# Patient Record
Sex: Female | Born: 1975 | Race: White | Hispanic: No | Smoking: Current every day smoker
Health system: Southern US, Community
[De-identification: ages and names within clinical notes are randomized; demographics above are authoritative.]

## PROBLEM LIST (undated history)

## (undated) DIAGNOSIS — F419 Anxiety disorder, unspecified: Secondary | ICD-10-CM

## (undated) DIAGNOSIS — F329 Major depressive disorder, single episode, unspecified: Secondary | ICD-10-CM

## (undated) DIAGNOSIS — K219 Gastro-esophageal reflux disease without esophagitis: Secondary | ICD-10-CM

## (undated) DIAGNOSIS — F4001 Agoraphobia with panic disorder: Secondary | ICD-10-CM

## (undated) DIAGNOSIS — Z86711 Personal history of pulmonary embolism: Secondary | ICD-10-CM

## (undated) DIAGNOSIS — G43009 Migraine without aura, not intractable, without status migrainosus: Secondary | ICD-10-CM

## (undated) DIAGNOSIS — Z720 Tobacco use: Secondary | ICD-10-CM

## (undated) DIAGNOSIS — Z8719 Personal history of other diseases of the digestive system: Secondary | ICD-10-CM

## (undated) DIAGNOSIS — F32A Depression, unspecified: Secondary | ICD-10-CM

## (undated) DIAGNOSIS — D6851 Activated protein C resistance: Secondary | ICD-10-CM

## (undated) DIAGNOSIS — F410 Panic disorder [episodic paroxysmal anxiety] without agoraphobia: Secondary | ICD-10-CM

## (undated) HISTORY — DX: Major depressive disorder, single episode, unspecified: F32.9

## (undated) HISTORY — DX: Anxiety disorder, unspecified: F41.9

## (undated) HISTORY — DX: Panic disorder (episodic paroxysmal anxiety): F41.0

## (undated) HISTORY — DX: Depression, unspecified: F32.A

## (undated) HISTORY — DX: Migraine without aura, not intractable, without status migrainosus: G43.009

## (undated) HISTORY — DX: Gastro-esophageal reflux disease without esophagitis: K21.9

## (undated) HISTORY — DX: Personal history of other diseases of the digestive system: Z87.19

## (undated) HISTORY — DX: Tobacco use: Z72.0

## (undated) HISTORY — DX: Agoraphobia with panic disorder: F40.01

---

## 1998-03-13 ENCOUNTER — Inpatient Hospital Stay (HOSPITAL_COMMUNITY): Admission: AD | Admit: 1998-03-13 | Discharge: 1998-03-13 | Payer: Self-pay | Admitting: Obstetrics and Gynecology

## 1998-06-01 ENCOUNTER — Inpatient Hospital Stay (HOSPITAL_COMMUNITY): Admission: AD | Admit: 1998-06-01 | Discharge: 1998-06-01 | Payer: Self-pay | Admitting: Obstetrics and Gynecology

## 1998-06-20 ENCOUNTER — Inpatient Hospital Stay (HOSPITAL_COMMUNITY): Admission: AD | Admit: 1998-06-20 | Discharge: 1998-06-20 | Payer: Self-pay | Admitting: *Deleted

## 1998-07-18 ENCOUNTER — Inpatient Hospital Stay (HOSPITAL_COMMUNITY): Admission: AD | Admit: 1998-07-18 | Discharge: 1998-07-18 | Payer: Self-pay | Admitting: Obstetrics and Gynecology

## 1998-07-22 ENCOUNTER — Inpatient Hospital Stay (HOSPITAL_COMMUNITY): Admission: AD | Admit: 1998-07-22 | Discharge: 1998-07-22 | Payer: Self-pay | Admitting: Obstetrics and Gynecology

## 1998-07-27 ENCOUNTER — Inpatient Hospital Stay (HOSPITAL_COMMUNITY): Admission: AD | Admit: 1998-07-27 | Discharge: 1998-07-29 | Payer: Self-pay | Admitting: Obstetrics & Gynecology

## 1998-08-19 ENCOUNTER — Encounter: Payer: Self-pay | Admitting: Obstetrics and Gynecology

## 1998-08-19 ENCOUNTER — Encounter: Payer: Self-pay | Admitting: Emergency Medicine

## 1998-08-19 ENCOUNTER — Observation Stay (HOSPITAL_COMMUNITY): Admission: EM | Admit: 1998-08-19 | Discharge: 1998-08-20 | Payer: Self-pay | Admitting: Emergency Medicine

## 1998-10-29 HISTORY — PX: CAROTID STENT: SHX1301

## 1999-11-18 ENCOUNTER — Emergency Department (HOSPITAL_COMMUNITY): Admission: EM | Admit: 1999-11-18 | Discharge: 1999-11-18 | Payer: Self-pay | Admitting: Emergency Medicine

## 2000-01-06 ENCOUNTER — Encounter: Admission: RE | Admit: 2000-01-06 | Discharge: 2000-01-06 | Payer: Self-pay | Admitting: Family Medicine

## 2000-01-12 ENCOUNTER — Emergency Department (HOSPITAL_COMMUNITY): Admission: EM | Admit: 2000-01-12 | Discharge: 2000-01-12 | Payer: Self-pay | Admitting: Emergency Medicine

## 2000-01-13 ENCOUNTER — Encounter: Admission: RE | Admit: 2000-01-13 | Discharge: 2000-01-13 | Payer: Self-pay | Admitting: Family Medicine

## 2000-03-17 ENCOUNTER — Encounter: Admission: RE | Admit: 2000-03-17 | Discharge: 2000-03-17 | Payer: Self-pay | Admitting: Family Medicine

## 2000-07-13 ENCOUNTER — Encounter: Admission: RE | Admit: 2000-07-13 | Discharge: 2000-07-13 | Payer: Self-pay | Admitting: Family Medicine

## 2000-08-04 ENCOUNTER — Encounter: Admission: RE | Admit: 2000-08-04 | Discharge: 2000-08-04 | Payer: Self-pay | Admitting: Family Medicine

## 2000-09-09 ENCOUNTER — Encounter: Admission: RE | Admit: 2000-09-09 | Discharge: 2000-09-09 | Payer: Self-pay | Admitting: Family Medicine

## 2000-10-26 ENCOUNTER — Emergency Department (HOSPITAL_COMMUNITY): Admission: EM | Admit: 2000-10-26 | Discharge: 2000-10-26 | Payer: Self-pay | Admitting: Emergency Medicine

## 2000-11-23 ENCOUNTER — Encounter: Admission: RE | Admit: 2000-11-23 | Discharge: 2000-11-23 | Payer: Self-pay | Admitting: Family Medicine

## 2000-12-14 ENCOUNTER — Encounter: Admission: RE | Admit: 2000-12-14 | Discharge: 2000-12-14 | Payer: Self-pay | Admitting: Family Medicine

## 2001-01-10 ENCOUNTER — Encounter: Payer: Self-pay | Admitting: *Deleted

## 2001-01-10 ENCOUNTER — Encounter: Admission: RE | Admit: 2001-01-10 | Discharge: 2001-01-10 | Payer: Self-pay | Admitting: Family Medicine

## 2001-01-10 ENCOUNTER — Ambulatory Visit (HOSPITAL_COMMUNITY): Admission: RE | Admit: 2001-01-10 | Discharge: 2001-01-10 | Payer: Self-pay | Admitting: *Deleted

## 2001-01-12 ENCOUNTER — Encounter: Payer: Self-pay | Admitting: Sports Medicine

## 2001-01-12 ENCOUNTER — Encounter: Admission: RE | Admit: 2001-01-12 | Discharge: 2001-01-12 | Payer: Self-pay | Admitting: Sports Medicine

## 2001-01-25 ENCOUNTER — Encounter: Admission: RE | Admit: 2001-01-25 | Discharge: 2001-01-25 | Payer: Self-pay | Admitting: Family Medicine

## 2001-01-27 ENCOUNTER — Encounter: Admission: RE | Admit: 2001-01-27 | Discharge: 2001-01-27 | Payer: Self-pay | Admitting: Family Medicine

## 2001-03-08 ENCOUNTER — Encounter: Admission: RE | Admit: 2001-03-08 | Discharge: 2001-03-08 | Payer: Self-pay | Admitting: Family Medicine

## 2001-03-08 ENCOUNTER — Other Ambulatory Visit: Admission: RE | Admit: 2001-03-08 | Discharge: 2001-03-08 | Payer: Self-pay | Admitting: Gynecology

## 2001-03-20 ENCOUNTER — Encounter: Admission: RE | Admit: 2001-03-20 | Discharge: 2001-03-20 | Payer: Self-pay | Admitting: Family Medicine

## 2001-04-03 ENCOUNTER — Encounter: Admission: RE | Admit: 2001-04-03 | Discharge: 2001-04-03 | Payer: Self-pay | Admitting: Family Medicine

## 2001-05-26 ENCOUNTER — Encounter: Admission: RE | Admit: 2001-05-26 | Discharge: 2001-05-26 | Payer: Self-pay | Admitting: Family Medicine

## 2001-07-10 ENCOUNTER — Encounter: Admission: RE | Admit: 2001-07-10 | Discharge: 2001-07-10 | Payer: Self-pay | Admitting: Family Medicine

## 2001-07-24 ENCOUNTER — Encounter: Admission: RE | Admit: 2001-07-24 | Discharge: 2001-07-24 | Payer: Self-pay | Admitting: Family Medicine

## 2001-08-01 ENCOUNTER — Encounter: Admission: RE | Admit: 2001-08-01 | Discharge: 2001-08-01 | Payer: Self-pay | Admitting: Family Medicine

## 2001-08-07 ENCOUNTER — Encounter: Admission: RE | Admit: 2001-08-07 | Discharge: 2001-08-07 | Payer: Self-pay | Admitting: Family Medicine

## 2001-09-07 ENCOUNTER — Encounter: Admission: RE | Admit: 2001-09-07 | Discharge: 2001-09-07 | Payer: Self-pay | Admitting: Family Medicine

## 2001-09-11 ENCOUNTER — Encounter: Admission: RE | Admit: 2001-09-11 | Discharge: 2001-09-11 | Payer: Self-pay | Admitting: Family Medicine

## 2001-10-25 ENCOUNTER — Encounter: Admission: RE | Admit: 2001-10-25 | Discharge: 2001-10-25 | Payer: Self-pay | Admitting: Family Medicine

## 2001-10-30 ENCOUNTER — Encounter: Admission: RE | Admit: 2001-10-30 | Discharge: 2001-10-30 | Payer: Self-pay | Admitting: Family Medicine

## 2001-12-08 ENCOUNTER — Encounter: Admission: RE | Admit: 2001-12-08 | Discharge: 2001-12-08 | Payer: Self-pay | Admitting: Family Medicine

## 2002-01-24 ENCOUNTER — Encounter: Admission: RE | Admit: 2002-01-24 | Discharge: 2002-01-24 | Payer: Self-pay | Admitting: Family Medicine

## 2002-02-20 ENCOUNTER — Encounter: Admission: RE | Admit: 2002-02-20 | Discharge: 2002-02-20 | Payer: Self-pay | Admitting: Family Medicine

## 2002-02-22 ENCOUNTER — Encounter: Admission: RE | Admit: 2002-02-22 | Discharge: 2002-02-22 | Payer: Self-pay | Admitting: Family Medicine

## 2002-03-14 ENCOUNTER — Encounter: Admission: RE | Admit: 2002-03-14 | Discharge: 2002-03-14 | Payer: Self-pay | Admitting: Family Medicine

## 2002-03-14 ENCOUNTER — Other Ambulatory Visit: Admission: RE | Admit: 2002-03-14 | Discharge: 2002-03-14 | Payer: Self-pay | Admitting: Family Medicine

## 2002-03-23 ENCOUNTER — Encounter: Admission: RE | Admit: 2002-03-23 | Discharge: 2002-03-23 | Payer: Self-pay | Admitting: Family Medicine

## 2002-04-24 ENCOUNTER — Encounter: Admission: RE | Admit: 2002-04-24 | Discharge: 2002-04-24 | Payer: Self-pay | Admitting: Family Medicine

## 2002-06-06 ENCOUNTER — Encounter: Admission: RE | Admit: 2002-06-06 | Discharge: 2002-06-06 | Payer: Self-pay | Admitting: Sports Medicine

## 2002-06-13 ENCOUNTER — Encounter: Admission: RE | Admit: 2002-06-13 | Discharge: 2002-06-13 | Payer: Self-pay | Admitting: Family Medicine

## 2002-06-20 ENCOUNTER — Encounter: Admission: RE | Admit: 2002-06-20 | Discharge: 2002-06-20 | Payer: Self-pay | Admitting: Family Medicine

## 2002-07-17 ENCOUNTER — Encounter: Admission: RE | Admit: 2002-07-17 | Discharge: 2002-07-17 | Payer: Self-pay | Admitting: Family Medicine

## 2002-07-25 ENCOUNTER — Encounter: Admission: RE | Admit: 2002-07-25 | Discharge: 2002-07-25 | Payer: Self-pay | Admitting: Family Medicine

## 2002-09-12 ENCOUNTER — Encounter: Admission: RE | Admit: 2002-09-12 | Discharge: 2002-09-12 | Payer: Self-pay | Admitting: Family Medicine

## 2002-09-19 ENCOUNTER — Encounter: Admission: RE | Admit: 2002-09-19 | Discharge: 2002-09-19 | Payer: Self-pay | Admitting: Family Medicine

## 2002-10-05 ENCOUNTER — Encounter: Admission: RE | Admit: 2002-10-05 | Discharge: 2002-10-05 | Payer: Self-pay | Admitting: Family Medicine

## 2002-10-08 ENCOUNTER — Encounter: Admission: RE | Admit: 2002-10-08 | Discharge: 2002-10-08 | Payer: Self-pay | Admitting: Sports Medicine

## 2002-10-08 ENCOUNTER — Encounter: Payer: Self-pay | Admitting: Sports Medicine

## 2002-10-17 ENCOUNTER — Encounter: Admission: RE | Admit: 2002-10-17 | Discharge: 2002-10-17 | Payer: Self-pay | Admitting: Family Medicine

## 2002-12-13 ENCOUNTER — Encounter: Admission: RE | Admit: 2002-12-13 | Discharge: 2002-12-13 | Payer: Self-pay | Admitting: Family Medicine

## 2003-01-10 ENCOUNTER — Encounter: Admission: RE | Admit: 2003-01-10 | Discharge: 2003-01-10 | Payer: Self-pay | Admitting: Family Medicine

## 2003-02-21 ENCOUNTER — Encounter: Admission: RE | Admit: 2003-02-21 | Discharge: 2003-02-21 | Payer: Self-pay | Admitting: Family Medicine

## 2003-03-01 ENCOUNTER — Encounter: Admission: RE | Admit: 2003-03-01 | Discharge: 2003-03-01 | Payer: Self-pay | Admitting: Family Medicine

## 2003-03-04 ENCOUNTER — Encounter: Admission: RE | Admit: 2003-03-04 | Discharge: 2003-03-04 | Payer: Self-pay | Admitting: Family Medicine

## 2003-03-29 ENCOUNTER — Encounter: Admission: RE | Admit: 2003-03-29 | Discharge: 2003-03-29 | Payer: Self-pay | Admitting: Family Medicine

## 2003-04-23 ENCOUNTER — Encounter: Admission: RE | Admit: 2003-04-23 | Discharge: 2003-04-23 | Payer: Self-pay | Admitting: Family Medicine

## 2003-04-24 ENCOUNTER — Encounter: Admission: RE | Admit: 2003-04-24 | Discharge: 2003-04-24 | Payer: Self-pay | Admitting: Family Medicine

## 2003-06-05 ENCOUNTER — Encounter: Admission: RE | Admit: 2003-06-05 | Discharge: 2003-06-05 | Payer: Self-pay | Admitting: Family Medicine

## 2003-06-07 ENCOUNTER — Encounter: Admission: RE | Admit: 2003-06-07 | Discharge: 2003-06-07 | Payer: Self-pay | Admitting: Sports Medicine

## 2003-06-20 ENCOUNTER — Encounter: Admission: RE | Admit: 2003-06-20 | Discharge: 2003-06-20 | Payer: Self-pay | Admitting: Family Medicine

## 2003-08-02 ENCOUNTER — Encounter: Admission: RE | Admit: 2003-08-02 | Discharge: 2003-08-02 | Payer: Self-pay | Admitting: Family Medicine

## 2003-08-02 ENCOUNTER — Other Ambulatory Visit: Admission: RE | Admit: 2003-08-02 | Discharge: 2003-08-02 | Payer: Self-pay | Admitting: Family Medicine

## 2003-08-09 ENCOUNTER — Encounter: Admission: RE | Admit: 2003-08-09 | Discharge: 2003-08-09 | Payer: Self-pay | Admitting: Family Medicine

## 2003-10-29 ENCOUNTER — Encounter: Admission: RE | Admit: 2003-10-29 | Discharge: 2003-10-29 | Payer: Self-pay | Admitting: Sports Medicine

## 2004-04-08 ENCOUNTER — Encounter: Admission: RE | Admit: 2004-04-08 | Discharge: 2004-04-08 | Payer: Self-pay | Admitting: Family Medicine

## 2004-06-03 ENCOUNTER — Encounter: Admission: RE | Admit: 2004-06-03 | Discharge: 2004-06-03 | Payer: Self-pay | Admitting: Family Medicine

## 2004-07-28 ENCOUNTER — Encounter: Admission: RE | Admit: 2004-07-28 | Discharge: 2004-07-28 | Payer: Self-pay | Admitting: Sports Medicine

## 2004-09-30 ENCOUNTER — Other Ambulatory Visit: Admission: RE | Admit: 2004-09-30 | Discharge: 2004-09-30 | Payer: Self-pay | Admitting: Family Medicine

## 2004-09-30 ENCOUNTER — Ambulatory Visit: Payer: Self-pay | Admitting: Family Medicine

## 2004-12-16 ENCOUNTER — Ambulatory Visit: Payer: Self-pay | Admitting: Family Medicine

## 2004-12-30 ENCOUNTER — Ambulatory Visit: Payer: Self-pay | Admitting: Family Medicine

## 2005-03-01 ENCOUNTER — Ambulatory Visit: Payer: Self-pay | Admitting: Family Medicine

## 2005-05-13 ENCOUNTER — Ambulatory Visit: Payer: Self-pay | Admitting: Family Medicine

## 2005-08-10 ENCOUNTER — Ambulatory Visit: Payer: Self-pay | Admitting: Family Medicine

## 2005-08-26 ENCOUNTER — Other Ambulatory Visit: Admission: RE | Admit: 2005-08-26 | Discharge: 2005-08-26 | Payer: Self-pay | Admitting: Obstetrics and Gynecology

## 2005-08-27 ENCOUNTER — Ambulatory Visit: Payer: Self-pay | Admitting: Family Medicine

## 2005-08-29 ENCOUNTER — Encounter (INDEPENDENT_AMBULATORY_CARE_PROVIDER_SITE_OTHER): Payer: Self-pay | Admitting: *Deleted

## 2005-09-01 ENCOUNTER — Ambulatory Visit: Payer: Self-pay | Admitting: Sports Medicine

## 2005-09-02 ENCOUNTER — Ambulatory Visit (HOSPITAL_COMMUNITY): Admission: RE | Admit: 2005-09-02 | Discharge: 2005-09-02 | Payer: Self-pay | Admitting: Family Medicine

## 2005-09-06 ENCOUNTER — Inpatient Hospital Stay (HOSPITAL_COMMUNITY): Admission: AD | Admit: 2005-09-06 | Discharge: 2005-09-06 | Payer: Self-pay | Admitting: Obstetrics & Gynecology

## 2005-09-07 ENCOUNTER — Inpatient Hospital Stay (HOSPITAL_COMMUNITY): Admission: AD | Admit: 2005-09-07 | Discharge: 2005-09-07 | Payer: Self-pay | Admitting: *Deleted

## 2005-09-08 ENCOUNTER — Ambulatory Visit: Payer: Self-pay | Admitting: Family Medicine

## 2005-09-08 ENCOUNTER — Inpatient Hospital Stay (HOSPITAL_COMMUNITY): Admission: AD | Admit: 2005-09-08 | Discharge: 2005-09-08 | Payer: Self-pay | Admitting: Obstetrics and Gynecology

## 2005-09-15 ENCOUNTER — Inpatient Hospital Stay (HOSPITAL_COMMUNITY): Admission: AD | Admit: 2005-09-15 | Discharge: 2005-09-15 | Payer: Self-pay | Admitting: Obstetrics and Gynecology

## 2005-11-15 ENCOUNTER — Ambulatory Visit: Payer: Self-pay | Admitting: Family Medicine

## 2006-01-13 ENCOUNTER — Inpatient Hospital Stay (HOSPITAL_COMMUNITY): Admission: AD | Admit: 2006-01-13 | Discharge: 2006-01-13 | Payer: Self-pay | Admitting: Obstetrics & Gynecology

## 2006-01-14 ENCOUNTER — Inpatient Hospital Stay (HOSPITAL_COMMUNITY): Admission: AD | Admit: 2006-01-14 | Discharge: 2006-01-14 | Payer: Self-pay | Admitting: *Deleted

## 2006-01-16 ENCOUNTER — Inpatient Hospital Stay (HOSPITAL_COMMUNITY): Admission: AD | Admit: 2006-01-16 | Discharge: 2006-01-16 | Payer: Self-pay | Admitting: Obstetrics and Gynecology

## 2006-01-26 ENCOUNTER — Inpatient Hospital Stay (HOSPITAL_COMMUNITY): Admission: RE | Admit: 2006-01-26 | Discharge: 2006-01-26 | Payer: Self-pay | Admitting: *Deleted

## 2006-03-14 ENCOUNTER — Ambulatory Visit: Payer: Self-pay | Admitting: Family Medicine

## 2006-03-24 ENCOUNTER — Ambulatory Visit: Payer: Self-pay | Admitting: Family Medicine

## 2006-03-31 ENCOUNTER — Inpatient Hospital Stay (HOSPITAL_COMMUNITY): Admission: AD | Admit: 2006-03-31 | Discharge: 2006-03-31 | Payer: Self-pay | Admitting: Gynecology

## 2006-04-06 ENCOUNTER — Ambulatory Visit: Payer: Self-pay | Admitting: Sports Medicine

## 2006-04-12 ENCOUNTER — Ambulatory Visit (HOSPITAL_COMMUNITY): Admission: RE | Admit: 2006-04-12 | Discharge: 2006-04-12 | Payer: Self-pay | Admitting: *Deleted

## 2006-04-12 ENCOUNTER — Ambulatory Visit: Payer: Self-pay | Admitting: *Deleted

## 2006-04-26 ENCOUNTER — Ambulatory Visit: Payer: Self-pay | Admitting: Family Medicine

## 2006-04-27 ENCOUNTER — Ambulatory Visit (HOSPITAL_COMMUNITY): Admission: RE | Admit: 2006-04-27 | Discharge: 2006-04-27 | Payer: Self-pay | Admitting: Obstetrics & Gynecology

## 2006-05-24 ENCOUNTER — Ambulatory Visit: Payer: Self-pay | Admitting: Family Medicine

## 2006-06-05 ENCOUNTER — Inpatient Hospital Stay (HOSPITAL_COMMUNITY): Admission: AD | Admit: 2006-06-05 | Discharge: 2006-06-05 | Payer: Self-pay | Admitting: Family Medicine

## 2006-06-19 ENCOUNTER — Inpatient Hospital Stay (HOSPITAL_COMMUNITY): Admission: AD | Admit: 2006-06-19 | Discharge: 2006-06-19 | Payer: Self-pay | Admitting: Gynecology

## 2006-06-19 ENCOUNTER — Ambulatory Visit: Payer: Self-pay | Admitting: Obstetrics and Gynecology

## 2006-06-21 ENCOUNTER — Ambulatory Visit: Payer: Self-pay | Admitting: Family Medicine

## 2006-07-11 ENCOUNTER — Ambulatory Visit: Payer: Self-pay | Admitting: Family Medicine

## 2006-07-25 ENCOUNTER — Ambulatory Visit: Payer: Self-pay | Admitting: Family Medicine

## 2006-07-26 ENCOUNTER — Ambulatory Visit (HOSPITAL_COMMUNITY): Admission: RE | Admit: 2006-07-26 | Discharge: 2006-07-26 | Payer: Self-pay | Admitting: Family Medicine

## 2006-08-04 ENCOUNTER — Ambulatory Visit: Payer: Self-pay | Admitting: Family Medicine

## 2006-08-09 ENCOUNTER — Inpatient Hospital Stay (HOSPITAL_COMMUNITY): Admission: AD | Admit: 2006-08-09 | Discharge: 2006-08-09 | Payer: Self-pay | Admitting: Obstetrics and Gynecology

## 2006-08-17 ENCOUNTER — Ambulatory Visit: Payer: Self-pay | Admitting: Family Medicine

## 2006-08-23 ENCOUNTER — Inpatient Hospital Stay (HOSPITAL_COMMUNITY): Admission: AD | Admit: 2006-08-23 | Discharge: 2006-08-23 | Payer: Self-pay | Admitting: Gynecology

## 2006-08-30 ENCOUNTER — Inpatient Hospital Stay (HOSPITAL_COMMUNITY): Admission: AD | Admit: 2006-08-30 | Discharge: 2006-08-30 | Payer: Self-pay | Admitting: Obstetrics and Gynecology

## 2006-08-30 ENCOUNTER — Ambulatory Visit: Payer: Self-pay | Admitting: Obstetrics and Gynecology

## 2006-08-30 ENCOUNTER — Ambulatory Visit: Payer: Self-pay | Admitting: Family Medicine

## 2006-08-31 ENCOUNTER — Inpatient Hospital Stay (HOSPITAL_COMMUNITY): Admission: AD | Admit: 2006-08-31 | Discharge: 2006-08-31 | Payer: Self-pay | Admitting: Gynecology

## 2006-09-02 ENCOUNTER — Ambulatory Visit: Payer: Self-pay | Admitting: Family Medicine

## 2006-09-05 ENCOUNTER — Ambulatory Visit (HOSPITAL_COMMUNITY): Admission: RE | Admit: 2006-09-05 | Discharge: 2006-09-05 | Payer: Self-pay | Admitting: Family Medicine

## 2006-09-05 ENCOUNTER — Ambulatory Visit: Payer: Self-pay | Admitting: Obstetrics & Gynecology

## 2006-09-05 ENCOUNTER — Inpatient Hospital Stay (HOSPITAL_COMMUNITY): Admission: AD | Admit: 2006-09-05 | Discharge: 2006-09-06 | Payer: Self-pay | Admitting: Obstetrics and Gynecology

## 2006-09-06 ENCOUNTER — Inpatient Hospital Stay (HOSPITAL_COMMUNITY): Admission: AD | Admit: 2006-09-06 | Discharge: 2006-09-08 | Payer: Self-pay | Admitting: Obstetrics and Gynecology

## 2006-09-06 ENCOUNTER — Ambulatory Visit: Payer: Self-pay | Admitting: Obstetrics & Gynecology

## 2006-10-19 ENCOUNTER — Ambulatory Visit: Payer: Self-pay | Admitting: Family Medicine

## 2006-11-02 ENCOUNTER — Ambulatory Visit: Payer: Self-pay

## 2007-01-19 ENCOUNTER — Ambulatory Visit: Payer: Self-pay | Admitting: Family Medicine

## 2007-01-26 DIAGNOSIS — G43909 Migraine, unspecified, not intractable, without status migrainosus: Secondary | ICD-10-CM | POA: Insufficient documentation

## 2007-01-26 DIAGNOSIS — F410 Panic disorder [episodic paroxysmal anxiety] without agoraphobia: Secondary | ICD-10-CM

## 2007-01-26 DIAGNOSIS — N200 Calculus of kidney: Secondary | ICD-10-CM

## 2007-01-26 DIAGNOSIS — F17200 Nicotine dependence, unspecified, uncomplicated: Secondary | ICD-10-CM

## 2007-01-26 DIAGNOSIS — F411 Generalized anxiety disorder: Secondary | ICD-10-CM | POA: Insufficient documentation

## 2007-01-26 HISTORY — DX: Panic disorder (episodic paroxysmal anxiety): F41.0

## 2007-01-27 ENCOUNTER — Encounter (INDEPENDENT_AMBULATORY_CARE_PROVIDER_SITE_OTHER): Payer: Self-pay | Admitting: *Deleted

## 2007-03-17 ENCOUNTER — Ambulatory Visit: Payer: Self-pay | Admitting: Family Medicine

## 2007-03-17 ENCOUNTER — Encounter: Payer: Self-pay | Admitting: Family Medicine

## 2007-03-17 LAB — CONVERTED CEMR LAB
Beta hcg, urine, semiquantitative: POSITIVE
Eosinophils Absolute: 0.3 10*3/uL (ref 0.0–0.7)
Hepatitis B Surface Ag: NEGATIVE
Lymphs Abs: 2.7 10*3/uL (ref 0.7–3.3)
MCV: 89.2 fL (ref 78.0–100.0)
Neutrophils Relative %: 57 % (ref 43–77)
Platelets: 262 10*3/uL (ref 150–400)
Rh Type: POSITIVE
WBC: 8.3 10*3/uL (ref 4.0–10.5)

## 2007-03-23 ENCOUNTER — Encounter: Payer: Self-pay | Admitting: Family Medicine

## 2007-03-23 ENCOUNTER — Ambulatory Visit: Payer: Self-pay | Admitting: Sports Medicine

## 2007-04-11 ENCOUNTER — Telehealth: Payer: Self-pay | Admitting: *Deleted

## 2007-04-17 ENCOUNTER — Ambulatory Visit (HOSPITAL_COMMUNITY): Admission: RE | Admit: 2007-04-17 | Discharge: 2007-04-17 | Payer: Self-pay | Admitting: Family Medicine

## 2007-04-17 ENCOUNTER — Encounter: Payer: Self-pay | Admitting: Family Medicine

## 2007-04-17 ENCOUNTER — Ambulatory Visit: Payer: Self-pay | Admitting: Sports Medicine

## 2007-04-17 ENCOUNTER — Other Ambulatory Visit: Admission: RE | Admit: 2007-04-17 | Discharge: 2007-04-17 | Payer: Self-pay | Admitting: Family Medicine

## 2007-04-17 LAB — CONVERTED CEMR LAB
Chlamydia, DNA Probe: NEGATIVE
GC Probe Amp, Genital: NEGATIVE

## 2007-04-26 ENCOUNTER — Telehealth: Payer: Self-pay | Admitting: *Deleted

## 2007-05-23 ENCOUNTER — Ambulatory Visit: Payer: Self-pay | Admitting: Family Medicine

## 2007-05-23 ENCOUNTER — Encounter: Payer: Self-pay | Admitting: Family Medicine

## 2007-06-01 ENCOUNTER — Telehealth: Payer: Self-pay | Admitting: *Deleted

## 2007-06-03 ENCOUNTER — Telehealth: Payer: Self-pay | Admitting: Family Medicine

## 2007-06-14 ENCOUNTER — Telehealth: Payer: Self-pay | Admitting: Family Medicine

## 2007-06-17 ENCOUNTER — Telehealth: Payer: Self-pay | Admitting: Family Medicine

## 2007-06-20 ENCOUNTER — Ambulatory Visit: Payer: Self-pay | Admitting: Family Medicine

## 2007-06-23 ENCOUNTER — Telehealth: Payer: Self-pay | Admitting: *Deleted

## 2007-06-29 ENCOUNTER — Telehealth: Payer: Self-pay | Admitting: Family Medicine

## 2007-07-04 ENCOUNTER — Ambulatory Visit (HOSPITAL_COMMUNITY): Admission: RE | Admit: 2007-07-04 | Discharge: 2007-07-04 | Payer: Self-pay | Admitting: Family Medicine

## 2007-07-07 ENCOUNTER — Telehealth: Payer: Self-pay | Admitting: Family Medicine

## 2007-07-10 ENCOUNTER — Encounter: Payer: Self-pay | Admitting: Family Medicine

## 2007-07-27 ENCOUNTER — Inpatient Hospital Stay (HOSPITAL_COMMUNITY): Admission: AD | Admit: 2007-07-27 | Discharge: 2007-07-27 | Payer: Self-pay | Admitting: Family Medicine

## 2007-07-27 ENCOUNTER — Encounter: Payer: Self-pay | Admitting: Family Medicine

## 2007-07-27 ENCOUNTER — Ambulatory Visit: Payer: Self-pay | Admitting: Family Medicine

## 2007-07-27 ENCOUNTER — Ambulatory Visit: Payer: Self-pay | Admitting: Obstetrics and Gynecology

## 2007-07-27 LAB — CONVERTED CEMR LAB
Glucose, Urine, Semiquant: NEGATIVE
Protein, U semiquant: NEGATIVE

## 2007-07-28 ENCOUNTER — Telehealth: Payer: Self-pay | Admitting: Family Medicine

## 2007-08-01 ENCOUNTER — Telehealth: Payer: Self-pay | Admitting: *Deleted

## 2007-08-02 ENCOUNTER — Ambulatory Visit: Payer: Self-pay | Admitting: Obstetrics and Gynecology

## 2007-08-02 ENCOUNTER — Inpatient Hospital Stay (HOSPITAL_COMMUNITY): Admission: AD | Admit: 2007-08-02 | Discharge: 2007-08-02 | Payer: Self-pay | Admitting: Obstetrics & Gynecology

## 2007-08-02 ENCOUNTER — Telehealth: Payer: Self-pay | Admitting: *Deleted

## 2007-08-03 ENCOUNTER — Ambulatory Visit: Payer: Self-pay | Admitting: Family Medicine

## 2007-08-03 ENCOUNTER — Observation Stay (HOSPITAL_COMMUNITY): Admission: AD | Admit: 2007-08-03 | Discharge: 2007-08-04 | Payer: Self-pay | Admitting: Obstetrics and Gynecology

## 2007-08-03 ENCOUNTER — Ambulatory Visit: Payer: Self-pay | Admitting: *Deleted

## 2007-08-07 ENCOUNTER — Encounter: Payer: Self-pay | Admitting: Family Medicine

## 2007-08-07 ENCOUNTER — Ambulatory Visit (HOSPITAL_COMMUNITY): Admission: RE | Admit: 2007-08-07 | Discharge: 2007-08-07 | Payer: Self-pay | Admitting: *Deleted

## 2007-08-10 ENCOUNTER — Ambulatory Visit: Payer: Self-pay | Admitting: *Deleted

## 2007-08-15 ENCOUNTER — Ambulatory Visit (HOSPITAL_COMMUNITY): Admission: RE | Admit: 2007-08-15 | Discharge: 2007-08-15 | Payer: Self-pay | Admitting: *Deleted

## 2007-08-24 ENCOUNTER — Ambulatory Visit: Payer: Self-pay | Admitting: Obstetrics & Gynecology

## 2007-08-24 ENCOUNTER — Ambulatory Visit (HOSPITAL_COMMUNITY): Admission: RE | Admit: 2007-08-24 | Discharge: 2007-08-24 | Payer: Self-pay | Admitting: *Deleted

## 2007-09-07 ENCOUNTER — Ambulatory Visit (HOSPITAL_COMMUNITY): Admission: RE | Admit: 2007-09-07 | Discharge: 2007-09-07 | Payer: Self-pay | Admitting: *Deleted

## 2007-09-07 ENCOUNTER — Ambulatory Visit: Payer: Self-pay | Admitting: *Deleted

## 2007-09-14 ENCOUNTER — Ambulatory Visit: Payer: Self-pay | Admitting: Family Medicine

## 2007-09-22 ENCOUNTER — Inpatient Hospital Stay (HOSPITAL_COMMUNITY): Admission: AD | Admit: 2007-09-22 | Discharge: 2007-09-22 | Payer: Self-pay | Admitting: Obstetrics & Gynecology

## 2007-09-22 ENCOUNTER — Ambulatory Visit: Payer: Self-pay | Admitting: Obstetrics and Gynecology

## 2007-09-27 ENCOUNTER — Telehealth: Payer: Self-pay | Admitting: *Deleted

## 2007-09-28 ENCOUNTER — Ambulatory Visit (HOSPITAL_COMMUNITY): Admission: RE | Admit: 2007-09-28 | Discharge: 2007-09-28 | Payer: Self-pay | Admitting: *Deleted

## 2007-09-28 ENCOUNTER — Ambulatory Visit: Payer: Self-pay | Admitting: *Deleted

## 2007-10-01 ENCOUNTER — Ambulatory Visit: Payer: Self-pay | Admitting: Obstetrics and Gynecology

## 2007-10-01 ENCOUNTER — Inpatient Hospital Stay (HOSPITAL_COMMUNITY): Admission: AD | Admit: 2007-10-01 | Discharge: 2007-10-03 | Payer: Self-pay | Admitting: Obstetrics and Gynecology

## 2007-10-03 ENCOUNTER — Ambulatory Visit: Payer: Self-pay | Admitting: *Deleted

## 2007-10-03 ENCOUNTER — Inpatient Hospital Stay (HOSPITAL_COMMUNITY): Admission: AD | Admit: 2007-10-03 | Discharge: 2007-10-03 | Payer: Self-pay | Admitting: Obstetrics & Gynecology

## 2007-10-07 ENCOUNTER — Inpatient Hospital Stay (HOSPITAL_COMMUNITY): Admission: AD | Admit: 2007-10-07 | Discharge: 2007-10-07 | Payer: Self-pay | Admitting: Obstetrics & Gynecology

## 2007-10-07 ENCOUNTER — Ambulatory Visit: Payer: Self-pay | Admitting: *Deleted

## 2007-10-12 ENCOUNTER — Ambulatory Visit: Payer: Self-pay | Admitting: *Deleted

## 2007-10-13 ENCOUNTER — Ambulatory Visit (HOSPITAL_COMMUNITY): Admission: RE | Admit: 2007-10-13 | Discharge: 2007-10-13 | Payer: Self-pay | Admitting: *Deleted

## 2007-10-19 ENCOUNTER — Ambulatory Visit: Payer: Self-pay | Admitting: Obstetrics & Gynecology

## 2007-10-19 ENCOUNTER — Other Ambulatory Visit: Payer: Self-pay | Admitting: Obstetrics & Gynecology

## 2007-10-20 ENCOUNTER — Ambulatory Visit (HOSPITAL_COMMUNITY): Admission: RE | Admit: 2007-10-20 | Discharge: 2007-10-20 | Payer: Self-pay | Admitting: *Deleted

## 2007-10-27 ENCOUNTER — Encounter: Payer: Self-pay | Admitting: Obstetrics & Gynecology

## 2007-10-27 ENCOUNTER — Ambulatory Visit: Payer: Self-pay | Admitting: Obstetrics and Gynecology

## 2007-10-27 ENCOUNTER — Inpatient Hospital Stay (HOSPITAL_COMMUNITY): Admission: AD | Admit: 2007-10-27 | Discharge: 2007-10-29 | Payer: Self-pay | Admitting: *Deleted

## 2007-11-20 ENCOUNTER — Telehealth: Payer: Self-pay | Admitting: *Deleted

## 2008-01-01 ENCOUNTER — Ambulatory Visit: Payer: Self-pay | Admitting: Family Medicine

## 2008-01-01 LAB — CONVERTED CEMR LAB
Glucose, Urine, Semiquant: NEGATIVE
Specific Gravity, Urine: 1.015
WBC Urine, dipstick: NEGATIVE
pH: 6

## 2008-01-18 ENCOUNTER — Telehealth (INDEPENDENT_AMBULATORY_CARE_PROVIDER_SITE_OTHER): Payer: Self-pay | Admitting: *Deleted

## 2008-03-04 ENCOUNTER — Telehealth: Payer: Self-pay | Admitting: *Deleted

## 2008-03-05 ENCOUNTER — Ambulatory Visit: Payer: Self-pay | Admitting: Family Medicine

## 2008-03-05 ENCOUNTER — Encounter: Payer: Self-pay | Admitting: Family Medicine

## 2008-03-05 DIAGNOSIS — D281 Benign neoplasm of vagina: Secondary | ICD-10-CM

## 2008-03-05 LAB — CONVERTED CEMR LAB
Blood in Urine, dipstick: NEGATIVE
Ketones, urine, test strip: NEGATIVE
Nitrite: NEGATIVE
Urobilinogen, UA: 0.2

## 2008-03-06 ENCOUNTER — Encounter: Payer: Self-pay | Admitting: Family Medicine

## 2008-03-07 ENCOUNTER — Telehealth: Payer: Self-pay | Admitting: Family Medicine

## 2008-05-23 ENCOUNTER — Telehealth (INDEPENDENT_AMBULATORY_CARE_PROVIDER_SITE_OTHER): Payer: Self-pay | Admitting: *Deleted

## 2008-05-30 ENCOUNTER — Ambulatory Visit: Payer: Self-pay | Admitting: Family Medicine

## 2008-06-01 ENCOUNTER — Telehealth: Payer: Self-pay | Admitting: Family Medicine

## 2008-07-04 ENCOUNTER — Encounter (INDEPENDENT_AMBULATORY_CARE_PROVIDER_SITE_OTHER): Payer: Self-pay | Admitting: *Deleted

## 2008-07-09 ENCOUNTER — Ambulatory Visit: Payer: Self-pay | Admitting: Family Medicine

## 2008-07-29 ENCOUNTER — Telehealth: Payer: Self-pay | Admitting: Family Medicine

## 2008-08-07 ENCOUNTER — Encounter: Payer: Self-pay | Admitting: Family Medicine

## 2008-08-24 ENCOUNTER — Telehealth (INDEPENDENT_AMBULATORY_CARE_PROVIDER_SITE_OTHER): Payer: Self-pay | Admitting: Family Medicine

## 2008-08-24 ENCOUNTER — Emergency Department (HOSPITAL_COMMUNITY): Admission: EM | Admit: 2008-08-24 | Discharge: 2008-08-24 | Payer: Self-pay | Admitting: Family Medicine

## 2008-09-11 ENCOUNTER — Telehealth: Payer: Self-pay | Admitting: *Deleted

## 2008-09-13 ENCOUNTER — Encounter: Payer: Self-pay | Admitting: Family Medicine

## 2008-09-19 ENCOUNTER — Telehealth: Payer: Self-pay | Admitting: Family Medicine

## 2008-11-01 ENCOUNTER — Encounter: Payer: Self-pay | Admitting: *Deleted

## 2008-11-11 ENCOUNTER — Ambulatory Visit: Payer: Self-pay | Admitting: Family Medicine

## 2008-11-11 DIAGNOSIS — M549 Dorsalgia, unspecified: Secondary | ICD-10-CM | POA: Insufficient documentation

## 2009-08-30 IMAGING — US US OB LIMITED
1 series · 14 of 28 positions shown · non-contrast
Comparison: none

OBSTETRICAL ULTRASOUND:
 This ultrasound was performed in The [HOSPITAL], and the [facility] report will be stored to [REDACTED] PACS.

[Series 1: us ob limited · 14 of 35 slices shown]
[im 2/35]
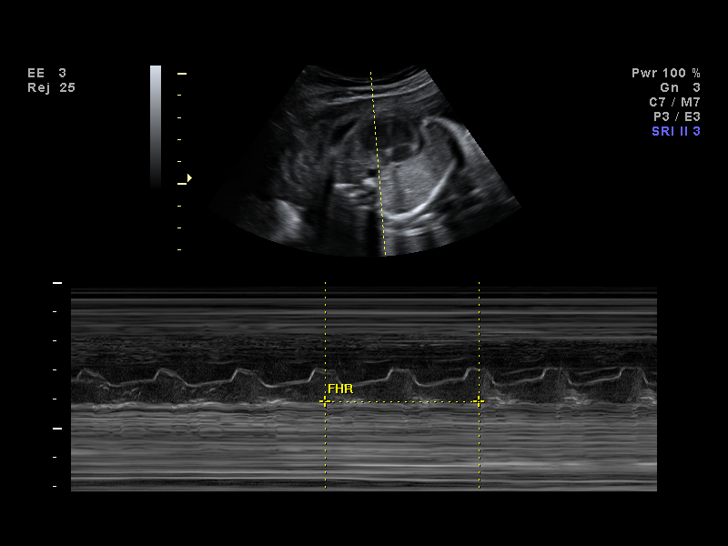
[im 4/35]
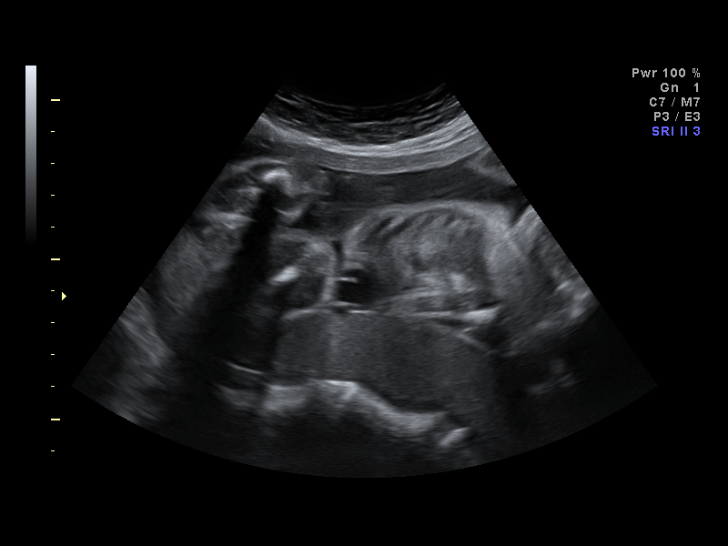
[im 7/35]
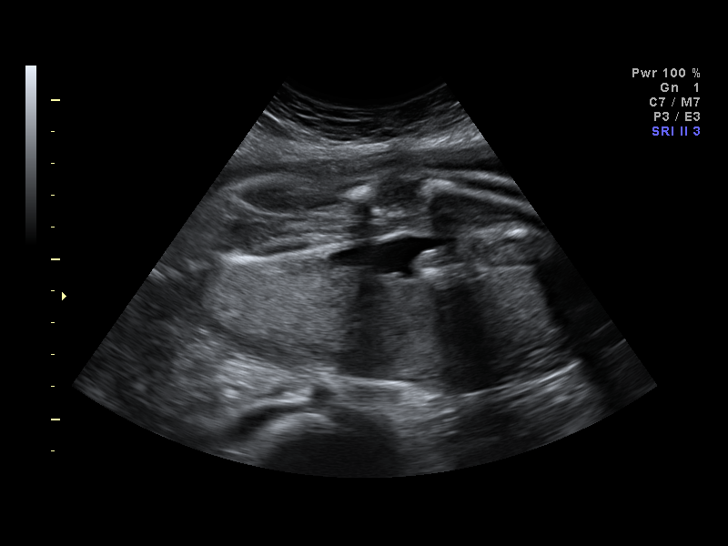
[im 9/35]
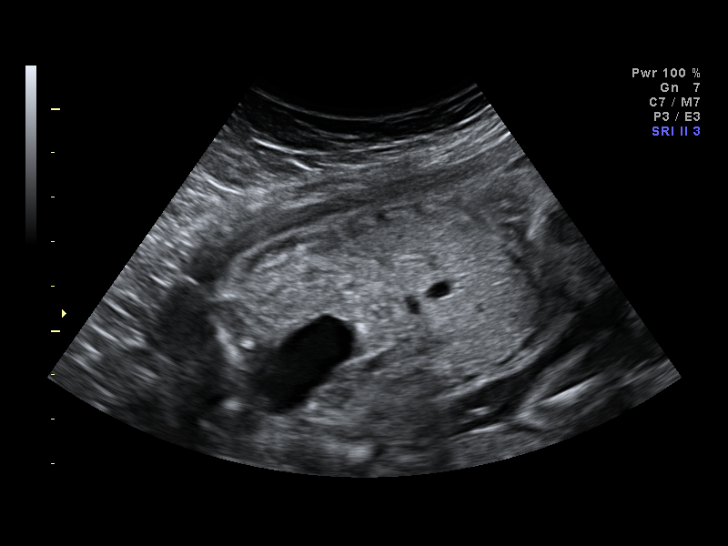
[im 12/35]
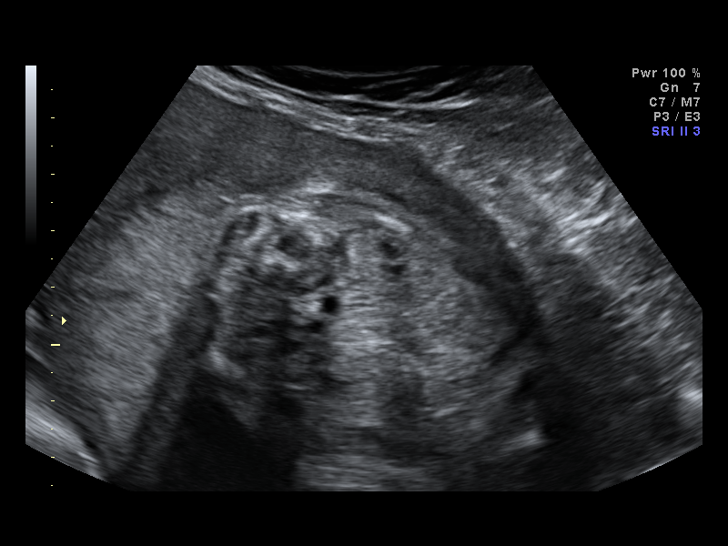
[im 14/35]
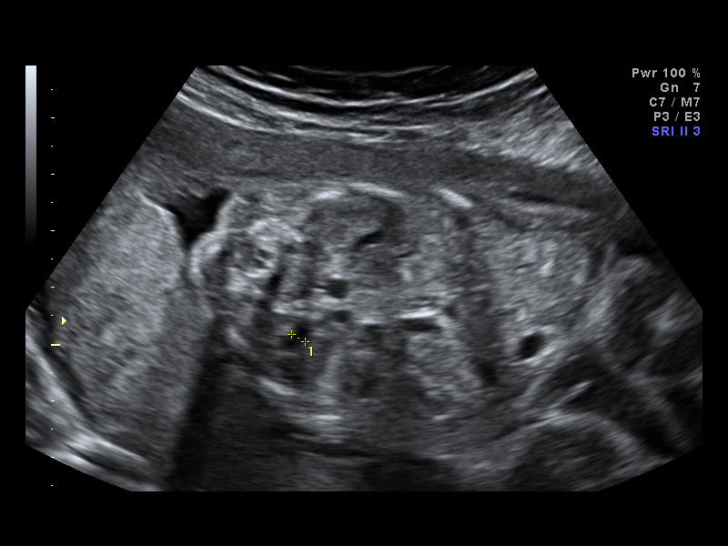
[im 17/35]
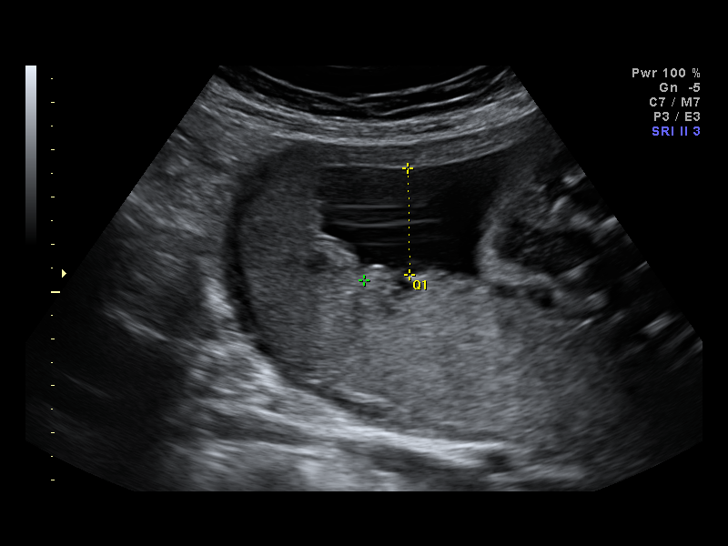
[im 19/35]
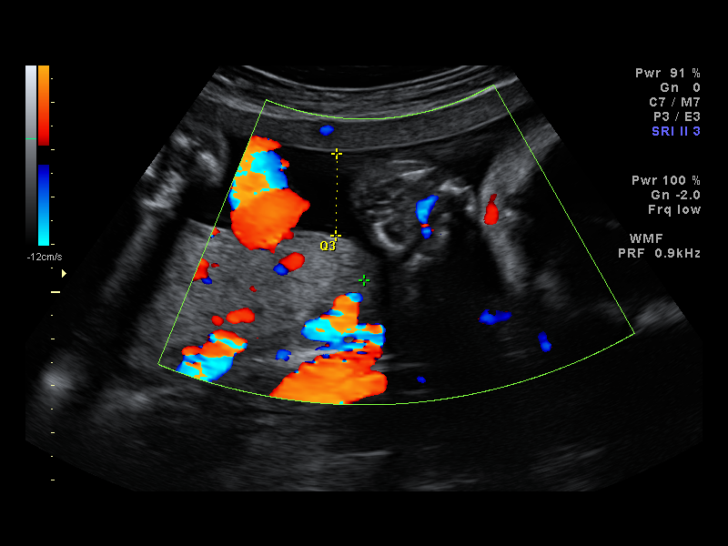
[im 22/35]
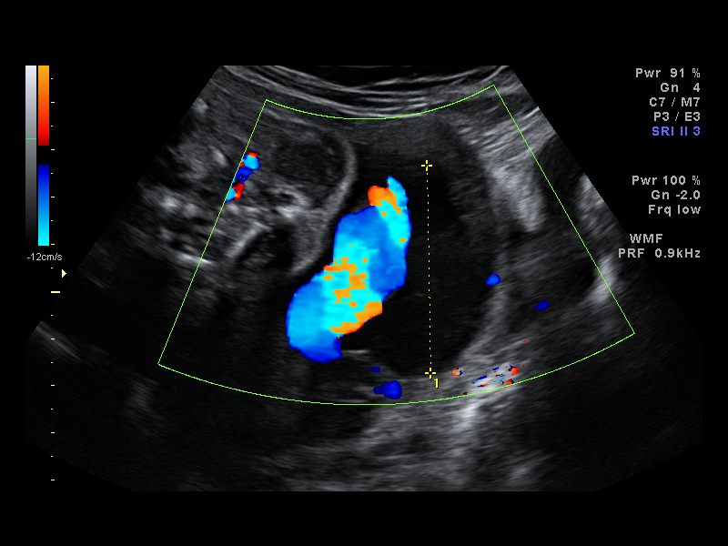
[im 24/35]
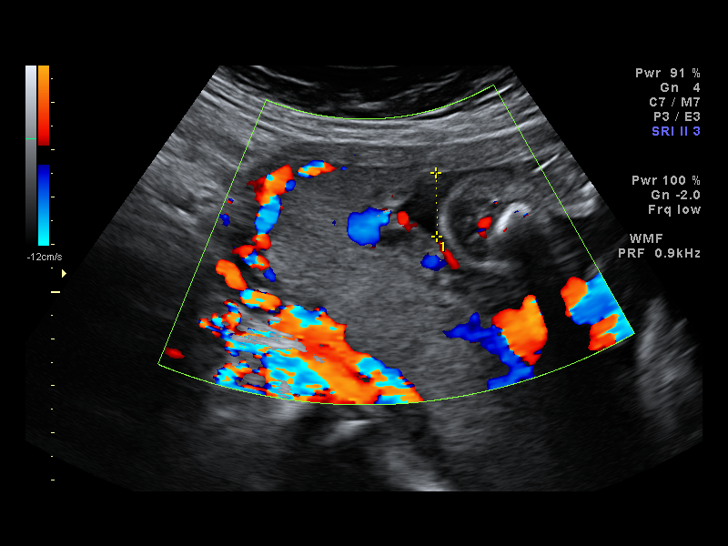
[im 27/35]
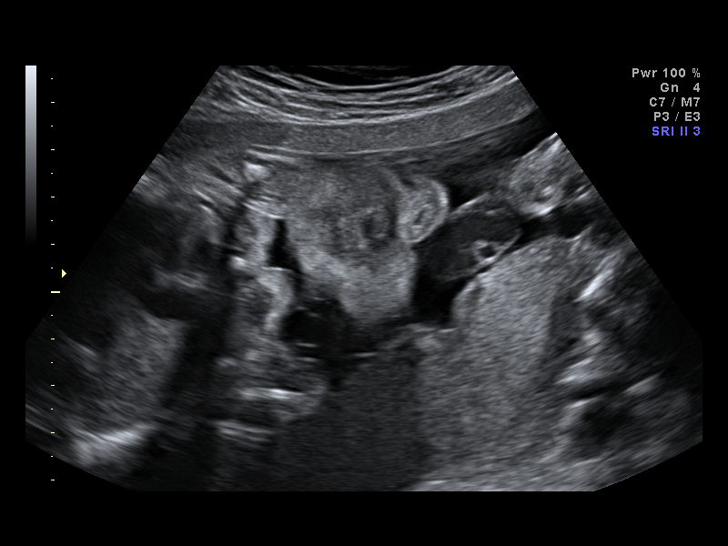
[im 29/35]
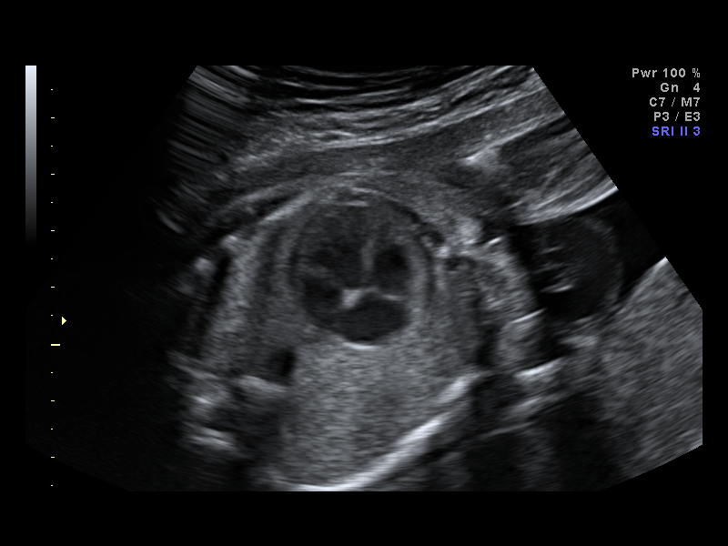
[im 32/35]
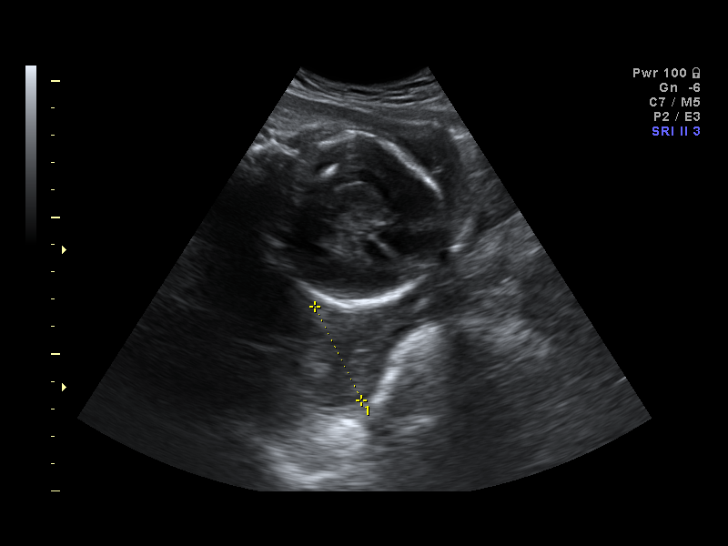
[im 35/35]
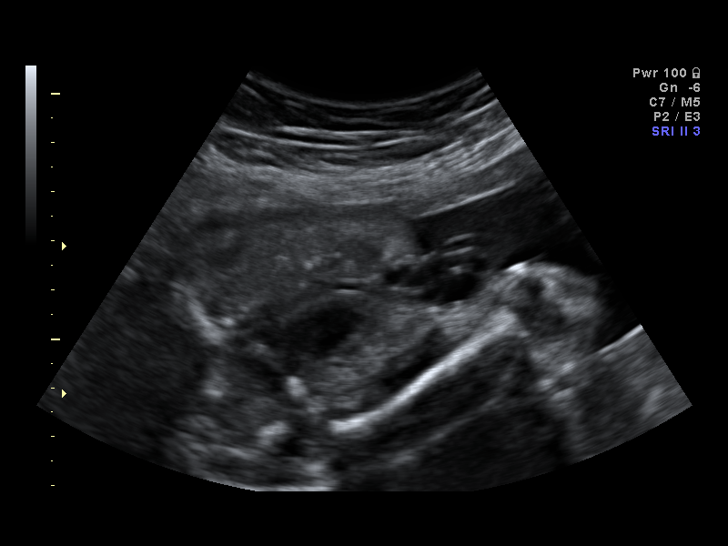

[14 of 28 positions shown; findings below may reference images not displayed]

IMPRESSION: The [facility] report has also been faxed to the ordering physician.

## 2009-10-08 ENCOUNTER — Other Ambulatory Visit: Admission: RE | Admit: 2009-10-08 | Discharge: 2009-10-08 | Payer: Self-pay | Admitting: Family Medicine

## 2009-10-08 ENCOUNTER — Encounter: Payer: Self-pay | Admitting: Family Medicine

## 2009-10-08 ENCOUNTER — Ambulatory Visit: Payer: Self-pay | Admitting: Family Medicine

## 2009-10-08 LAB — CONVERTED CEMR LAB
Chlamydia, DNA Probe: NEGATIVE
GC Probe Amp, Genital: NEGATIVE

## 2009-10-13 ENCOUNTER — Telehealth: Payer: Self-pay | Admitting: Family Medicine

## 2009-10-15 ENCOUNTER — Ambulatory Visit: Payer: Self-pay | Admitting: Family Medicine

## 2009-10-15 DIAGNOSIS — F341 Dysthymic disorder: Secondary | ICD-10-CM

## 2009-10-16 ENCOUNTER — Telehealth: Payer: Self-pay | Admitting: Family Medicine

## 2009-10-20 ENCOUNTER — Telehealth: Payer: Self-pay | Admitting: Family Medicine

## 2009-11-04 ENCOUNTER — Telehealth: Payer: Self-pay | Admitting: Family Medicine

## 2009-12-23 ENCOUNTER — Telehealth: Payer: Self-pay | Admitting: Family Medicine

## 2010-01-06 ENCOUNTER — Emergency Department (HOSPITAL_COMMUNITY): Admission: EM | Admit: 2010-01-06 | Discharge: 2010-01-06 | Payer: Self-pay | Admitting: Family Medicine

## 2010-03-04 ENCOUNTER — Encounter: Payer: Self-pay | Admitting: Family Medicine

## 2010-03-04 ENCOUNTER — Ambulatory Visit: Payer: Self-pay | Admitting: Family Medicine

## 2010-03-04 LAB — CONVERTED CEMR LAB: TSH: 1.258 microintl units/mL (ref 0.350–4.500)

## 2010-09-07 ENCOUNTER — Telehealth: Payer: Self-pay | Admitting: Family Medicine

## 2010-09-07 ENCOUNTER — Emergency Department (HOSPITAL_COMMUNITY): Admission: EM | Admit: 2010-09-07 | Discharge: 2010-09-07 | Payer: Self-pay | Admitting: Emergency Medicine

## 2010-09-07 ENCOUNTER — Encounter: Payer: Self-pay | Admitting: Family Medicine

## 2010-09-23 ENCOUNTER — Ambulatory Visit: Payer: Self-pay | Admitting: Family Medicine

## 2010-09-24 ENCOUNTER — Telehealth: Payer: Self-pay | Admitting: Family Medicine

## 2010-10-09 ENCOUNTER — Telehealth: Payer: Self-pay | Admitting: Family Medicine

## 2010-10-27 ENCOUNTER — Telehealth: Payer: Self-pay | Admitting: *Deleted

## 2010-12-02 ENCOUNTER — Ambulatory Visit: Admission: RE | Admit: 2010-12-02 | Discharge: 2010-12-02 | Payer: Self-pay | Source: Home / Self Care

## 2010-12-07 ENCOUNTER — Encounter
Admission: RE | Admit: 2010-12-07 | Discharge: 2010-12-07 | Payer: Self-pay | Source: Home / Self Care | Attending: Family Medicine | Admitting: Family Medicine

## 2010-12-16 ENCOUNTER — Ambulatory Visit: Admission: RE | Admit: 2010-12-16 | Discharge: 2010-12-16 | Payer: Self-pay | Source: Home / Self Care

## 2010-12-20 ENCOUNTER — Encounter: Payer: Self-pay | Admitting: *Deleted

## 2010-12-29 NOTE — Assessment & Plan Note (Signed)
Summary: DNKA,AG      Current Allergies: No known allergies         Complete Medication List: 1)  Klonopin 1 Mg Tabs (Clonazepam) .... Take 1 tablet by mouth two times a day. 2)  Pepcid 40 Mg Tabs (Famotidine) .... Take 1 tablet by mouth once a day 3)  Prenatal 28-0.8 Mg Tabs (Prenatal vit-fe fumarate-fa) .... Take 1 tablet by mouth once a day 4)  Fioricet 50-325-40 Mg Tabs (Butalbital-apap-caffeine) .Marland Kitchen.. 1-2 by mouth q 4-6 hrs prn 5)  Buspar 15 Mg Tabs (Buspirone hcl) .Marland Kitchen.. 1 by mouth bid 6)  Mebendazole 100 Mg Chew (Mebendazole) .Marland Kitchen.. 1 tab by mouth x 1 dose.  repeat in 2 weeks.    ]

## 2010-12-29 NOTE — Progress Notes (Signed)
Summary: Boil  Phone Note Call from Patient   Caller: Patient Call For: [phone] Summary of Call: Pt has a boil that is painful.  Want to see someone about removing.   Initial call taken by: Abundio Miu,  September 07, 2010 10:45 AM  Follow-up for Phone Call        boil on labia.  started yesterday. painful. advised hot wet cloth & see md. we have no appts left. sent to [facility]. she agreed Follow-up by: Golden Circle RN,  September 07, 2010 10:53 AM

## 2010-12-29 NOTE — Progress Notes (Signed)
Summary: Rx Prob  Phone Note Call from Patient Call back at Home Phone [phone]   Caller: Patient Summary of Call: Pt concern about medication she is taking. Initial call taken by: Clydell Hakim,  September 24, 2010 11:36 AM  Follow-up for Phone Call         patient was started on Alprazolam yesterday , Klonopin was stopped. last night she had an episode and she took one alprazolam and it did nothing for her. she did not take another one and does  not want to do that unless MD orders.  in the past she has been switched back to aprazolam but she took a higher dose and took it more frequently.. she states if Dr. Janalyn Harder  doesn't want to increase the alprazolam she would rather go back to the klonopin twice daily. at least she knew she would be able to take another one  later in ths day if needed. advised will forward  message to MD .. Follow-up by: Theresia Lo RN,  September 24, 2010 11:52 AM  Additional Follow-up for Phone Call Additional follow up Details #1::        Called pt back. She can take .25mg  x 2 tabs each day as needed.  She will run out by 2 wks.  She can call me back at end of 2 wks and tell me if she likes that dose and we can continue on it.  Or we may go back to klonopin if that is preferred by pt.  She agreed.  Additional Follow-up by: Cat Ta MD,  September 24, 2010 2:15 PM

## 2010-12-29 NOTE — Miscellaneous (Signed)
Summary: Refill Clonazepam  Clinical Lists Changes  Medications: Changed medication from KLONOPIN 0.5 MG TABS (CLONAZEPAM) 1 tab by mouth two times a day. to KLONOPIN 0.5 MG TABS (CLONAZEPAM) 1 tab by mouth two times a day. Pt needs appointment for anymore refills. - Signed Rx of KLONOPIN 0.5 MG TABS (CLONAZEPAM) 1 tab by mouth two times a day. Pt needs appointment for anymore refills.;  #60 x 0;  Signed;  Entered by: Angeline Slim MD;  Authorized by: Angeline Slim MD;  Method used: Historical    Prescriptions: KLONOPIN 0.5 MG TABS (CLONAZEPAM) 1 tab by mouth two times a day. Pt needs appointment for anymore refills.  #60 x 0   Entered and Authorized by:   Angeline Slim MD   Signed by:   Angeline Slim MD on 09/07/2010   Method used:   Historical   RxID:   [id number]

## 2010-12-29 NOTE — Progress Notes (Signed)
  Phone Note Outgoing Call   Call placed by: Angeline Slim MD,  December 23, 2009 5:55 PM Call placed to: Patient Summary of Call: Pt dnka appt today.  I called to see how she was doing.  Spoke with pt.  Pt states she feels "just ok."  Pt feels that she may be having anxiety with working full time.  She is now working part time.  Feels that it is better.  She worries about her son alot.  We discussed slowly letting herself be away from home/her son.  We will take this one week at a time.  We will use positive reinforcement of coming home to her son and him being ok to help her get over her fears. She is spending more time with her other two children.  Going to movies.  Spending time with daughter.  She feels happy about the change she sees in her children, "they seem happier to me."  Pt finally attending parent-teacher conferences.   Relationship with husband: "it's ok. It's normal for any couple.  It has its moments.  It's healthy.  He's one person who completes understands me.  I'm not ambarassed to be myself around him." Medicine:  Would like to try something longer acting for anxiety.   Initial call taken by: Alexcis Bicking MD,  December 23, 2009 5:56 PM    New/Updated Medications: KLONOPIN 0.5 MG TABS (CLONAZEPAM) 1/2 tab by mouth two times a day for 3 days, then 1 tab by mouth two times a day. Prescriptions: KLONOPIN 0.5 MG TABS (CLONAZEPAM) 1/2 tab by mouth two times a day for 3 days, then 1 tab by mouth two times a day.  #64 x 1   Entered and Authorized by:   Angeline Slim MD   Signed by:   Angeline Slim MD on 12/23/2009   Method used:   Telephoned to ...       [facility] [address] [phone]* (retail)       [address]       [address] [place], [place]  [address]       Ph: [phone] or [phone]       Fax: [phone]   RxID:   [id number]

## 2010-12-29 NOTE — Progress Notes (Signed)
Summary: Anxiety   Phone Note Call from Patient   Caller: Patient Call For: [phone] Summary of Call: Pt calling about Alprozalam.  Pt wanted  to let physician that the meds is working fine.  Taking 2 once a day as instructed. Initial call taken by: Abundio Miu,  October 09, 2010 2:35 PM  Follow-up for Phone Call        Called pt back.  Pt doing well on Alprazolam 0.25 2 tabs almost daily.  She is doing well on this dose.   I will change her Rx to 0.5mg  by mouth daily as needed.  [facility]  Follow-up by: Cat Ta MD,  October 09, 2010 5:59 PM    New/Updated Medications: ALPRAZOLAM 0.5 MG TABS (ALPRAZOLAM) 1 tab by mouth daily as needed for anxiety Prescriptions: ALPRAZOLAM 0.5 MG TABS (ALPRAZOLAM) 1 tab by mouth daily as needed for anxiety  #30 x 4   Entered and Authorized by:   Angeline Slim MD   Signed by:   Angeline Slim MD on 10/09/2010   Method used:   Telephoned to ...       [facility] [address] [phone]* (retail)       [address]       [place], [place]  [address]       Ph: [phone] or [phone]       Fax: [phone]   RxID:   [id number]
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
## 2010-12-29 NOTE — Progress Notes (Signed)
Summary: meds prob/Ts  Phone Note Refill Request Call back at Home Phone [phone] Message from:  Patient  Refills Requested: Medication #1:  ALPRAZOLAM 0.5 MG TABS 1 tab by mouth daily as needed for anxiety.   Notes: once a day is not enough and wants to increase it to 2-3 x daily Initial call taken by: De Nurse,  October 27, 2010 10:30 AM  Follow-up for Phone Call        called pt and pt agreed with Dr.Ta's message. Follow-up by: Arlyss Repress CMA,,  October 27, 2010 11:52 AM    We discussed during last office that I will NOT be increasing this medication.  She has 4 refills from 10/09/10.  I will not increase # of tablets.  Pt will need to come in for office visit.  Cat Ta MD  October 27, 2010 10:58 AM

## 2010-12-31 NOTE — Assessment & Plan Note (Signed)
Summary: back pain/diffulty sleeping and doing daily chores/ta/b mc   Vital Signs:  Patient profile:   35 year old female Weight:      135 pounds Temp:     97.9 degrees F oral Pulse rate:   83 / minute Pulse rhythm:   regular BP sitting:   103 / 71  (left arm) Cuff size:   regular  Vitals Entered By: Loralee Pacas CMA (December 02, 2010 4:26 PM) CC: back pain Is Patient Diabetic? No Pain Assessment Patient in pain? yes     Location: lower back Intensity: 8   Primary Care Provider:  CAT TA MD  CC:  back pain.  History of Present Illness: 35 y/o F with back pain: Pt is having back pain that is worst at night, sometimes waking her up at night.  It has been more painful for 3-4 months, but started after the birth of her son in 2008 low back pain getting worse. Can't play with kids like she use to, can't garden like she use to.  No loss of bowel or bladder Taking Aleve, Tylenol, Ibuprofen but they aren't helping.  DJD runs in her family.   Current Medications (verified): 1)  Pepcid 40 Mg Tabs (Famotidine) .... Take 1 Tablet By Mouth Once A Day 2)  Paxil Cr 37.5 Mg Xr24h-Tab (Paroxetine Hcl) .Marland Kitchen.. 1 Tab By Mouth Every Morning 3)  Alprazolam 0.5 Mg Tabs (Alprazolam) .Marland Kitchen.. 1 Tab By Mouth Daily As Needed For Anxiety 4)  Cyclobenzaprine Hcl 10 Mg Tabs (Cyclobenzaprine Hcl) .... Tale 1 Pill Every Night For 15 Days To Help With Muscle Spams. 5)  Vicodin 5-500 Mg Tabs (Hydrocodone-Acetaminophen) .... Take 1 Pill Every 6-8 Hours As Needed For For Back Pain.  Allergies (verified): No Known Drug Allergies  Review of Systems        vitals reviewed and pertinent negatives and positives seen in HPI   Physical Exam  General:  Well-developed,well-nourished,in no acute distress; alert,appropriate and cooperative throughout examination Msk:  pt can lean forward with minimal pain, leaning back hurts past 10 degrees, no pain with straight leg raises. tenderness over lower lumbar and  sacral vertebre.    Impression & Recommendations:  Problem # 1:  BACK PAIN, CHRONIC, INTERMITTENT (ICD-724.5) Assessment Deteriorated Getting worse the last 3-4 months, pt says she has not had an x-ray. Plan to get x-ray today, plan to treat pain with stronger pain meds and muscle spasms with flexerill. If something is concerning on x-ray will refer to ortho, if normal will plan to refer to PT, Terrilee Files or Chiropractor.   Her updated medication list for this problem includes:    Cyclobenzaprine Hcl 10 Mg Tabs (Cyclobenzaprine hcl) .Marland Kitchen... Tale 1 pill every night for 15 days to help with muscle spams.    Vicodin 5-500 Mg Tabs (Hydrocodone-acetaminophen) .Marland Kitchen... Take 1 pill every 6-8 hours as needed for for back pain.  Orders: Diagnostic X-Ray/Fluoroscopy (Diagnostic X-Ray/Flu) [facility]- Est Level  3 (11914)  Complete Medication List: 1)  Pepcid 40 Mg Tabs (Famotidine) .... Take 1 tablet by mouth once a day 2)  Paxil Cr 37.5 Mg Xr24h-tab (Paroxetine hcl) .Marland Kitchen.. 1 tab by mouth every morning 3)  Alprazolam 0.5 Mg Tabs (Alprazolam) .Marland Kitchen.. 1 tab by mouth daily as needed for anxiety 4)  Cyclobenzaprine Hcl 10 Mg Tabs (Cyclobenzaprine hcl) .... Tale 1 pill every night for 15 days to help with muscle spams. 5)  Vicodin 5-500 Mg Tabs (Hydrocodone-acetaminophen) .... Take 1 pill every 6-8 hours  as needed for for back pain.  Patient Instructions: 1)  told her we would call with results Prescriptions: VICODIN 5-500 MG TABS (HYDROCODONE-ACETAMINOPHEN) take 1 pill every 6-8 hours as needed for for back pain.  #30 x 1   Entered and Authorized by:   Jamie Brookes MD   Signed by:   Jamie Brookes MD on 12/08/2010   Method used:   Handwritten   RxID:   [id number] CYCLOBENZAPRINE HCL 10 MG TABS (CYCLOBENZAPRINE HCL) tale 1 pill every night for 15 days to help with muscle spams.  #15 x 0   Entered and Authorized by:   Jamie Brookes MD   Signed by:   Jamie Brookes MD on 12/02/2010   Method used:    [facility]        [facility] [address] [phone]* (retail)       [address]       [place], [place]  [address]       Ph: [phone] or [phone]       Fax: [phone]   RxID:   [id number]    Orders Added: 1)  Diagnostic X-Ray/Fluoroscopy [Diagnostic X-Ray/Flu] 2)  [facility]- Est Level  3 [30160]

## 2010-12-31 NOTE — Assessment & Plan Note (Signed)
Summary: Anxiety   Vital Signs:  Patient profile:   35 year old female Height:      64 inches Weight:      137 pounds BMI:     23.60 Temp:     97.8 degrees F oral Pulse rate:   89 / minute BP sitting:   107 / 83  (left arm) Cuff size:   regular  Vitals Entered By: Tessie Fass CMA CC: F/U anxiety Meds   Primary Care Provider:  Nicholson Starace MD  CC:  F/U anxiety Meds.  History of Present Illness: 35 y/o F with chronic anxiety is here because she would like to make changes to anxiety medication.  The last time pt was in the office, we discussed longterm anxiety and weaning off of xanax.  Pt agreed to the plan of #30 tabs of xanax 0.5mg  per month.  Today she states that she would like three times a day xanax.  We again discussed that this is not a solution for longterm anxiety.  Pt has not seen a therapist for her issues.  She declined appt with Dr Pascal Lux.   She describes a constant feeling that she is having a panic attack.  She feels chest pressure daily, but she knows that she is not having a heart attack.  She states that her mother and husband states that she was doing better when she was taking xanax three times a day.  She is concerned that her anxiety is adversely affecting her son.  She does not like to leave the house.  she does not want to get specific about what has been bothering her/stressors.   Denies SI/HI.    Current Medications (verified): 1)  Pepcid 40 Mg Tabs (Famotidine) .... Take 1 Tablet By Mouth Once A Day 2)  Paxil Cr 37.5 Mg Xr24h-Tab (Paroxetine Hcl) .Marland Kitchen.. 1 Tab By Mouth Every Morning 3)  Alprazolam 0.5 Mg Tabs (Alprazolam) .Marland Kitchen.. 1 Tab By Mouth Daily As Needed For Anxiety  Allergies (verified): No Known Drug Allergies  Review of Systems General:  Denies chills and fever. Psych:  Complains of anxiety, easily angered, easily tearful, and panic attacks; denies suicidal thoughts/plans and thoughts /plans of harming others.  Physical Exam  General:   Well-developed,well-nourished,in no acute distress; alert,appropriate and cooperative throughout examination. Vitals reviewd.  Psych:  Oriented X3, memory intact for recent and remote, normally interactive, good eye contact, not anxious appearing, not suicidal, not homicidal, withdrawn, and judgment fair.     Impression & Recommendations:  Problem # 1:  ANXIETY (ICD-300.00) Assessment Unchanged Discussed again the plan for longterm dealing with anxiety.  Discussed that taking xanax three times a day is not a good longterm solution. She agreed with this conclusion.  We first discussed giving Buspar a try again, but pt also wanted to try taking Klonopin again.  Pt would like to continue Paxil, wean off xanax and restart klonopin.  We also discussed seeking therapy.  Gave pt contact information for Letha Cape and recommend that pt calls for appt.  Pt has been resistant to therapy.  I tried to emphasize the importance of therapy and medication for longterm resolution.  Pt to rtc in 1 month for f/u.   Her updated medication list for this problem includes:    Paxil Cr 37.5 Mg Xr24h-tab (Paroxetine hcl) .Marland Kitchen... 1 tab by mouth every morning    Alprazolam 0.5 Mg Tabs (Alprazolam) .Marland Kitchen... 1 tab by mouth daily as needed for anxiety    Klonopin 0.5  Mg Tabs (Clonazepam) .Marland Kitchen... 1/2 tab by mouth two times a day x 3 days, then 1 tab by mouth two times a day  Orders: [facility]- Est Level  3 (16109)  Complete Medication List: 1)  Pepcid 40 Mg Tabs (Famotidine) .... Take 1 tablet by mouth once a day 2)  Paxil Cr 37.5 Mg Xr24h-tab (Paroxetine hcl) .Marland Kitchen.. 1 tab by mouth every morning 3)  Alprazolam 0.5 Mg Tabs (Alprazolam) .Marland Kitchen.. 1 tab by mouth daily as needed for anxiety 4)  Klonopin 0.5 Mg Tabs (Clonazepam) .... 1/2 tab by mouth two times a day x 3 days, then 1 tab by mouth two times a day  Patient Instructions: 1)  Please schedule a follow-up appointment in 1 month for mood. 2)  I'm glad you want to try a long acting  medicine for your anxiety.  You can start Clonopin 0.5mg  again.  Please consider counseling with a therapist.  I do believe that therapy along with medication is a good longterm solution.  3)  Letha Cape, LCSW, ACSW  [phone]  Prescriptions: KLONOPIN 0.5 MG TABS (CLONAZEPAM) 1/2 tab by mouth two times a day x 3 days, then 1 tab by mouth two times a day  #60 x 1   Entered and Authorized by:   Angeline Slim MD   Signed by:   Angeline Slim MD on 12/16/2010   Method used:   Telephoned to ...       [facility] [address] [phone]* (retail)       [address]       [place], [place]  [address]       Ph: [phone] or [phone]       Fax: [phone]   RxID:   [id number]    Orders Added: 1)  [facility]- Est Level  3 [38756]

## 2011-02-15 ENCOUNTER — Ambulatory Visit (INDEPENDENT_AMBULATORY_CARE_PROVIDER_SITE_OTHER): Payer: Medicaid Other | Admitting: Family Medicine

## 2011-02-15 ENCOUNTER — Encounter: Payer: Self-pay | Admitting: Family Medicine

## 2011-02-15 VITALS — BP 116/77 | HR 87 | Wt 140.6 lb

## 2011-02-15 DIAGNOSIS — M25569 Pain in unspecified knee: Secondary | ICD-10-CM

## 2011-02-15 DIAGNOSIS — F411 Generalized anxiety disorder: Secondary | ICD-10-CM

## 2011-02-15 DIAGNOSIS — F341 Dysthymic disorder: Secondary | ICD-10-CM

## 2011-02-15 DIAGNOSIS — M25561 Pain in right knee: Secondary | ICD-10-CM

## 2011-02-15 MED ORDER — ALPRAZOLAM 0.5 MG PO TABS: 0.5000 mg | ORAL_TABLET | Freq: Every day | ORAL | Status: DC | PRN

## 2011-02-15 MED ORDER — CLONAZEPAM 0.5 MG PO TABS: ORAL_TABLET | ORAL | Status: DC

## 2011-02-15 NOTE — Patient Instructions (Signed)
Take Ibuprofen 400mg  -600mg  (2-3 tablets) three times per day x 1 week to help with knee pain. Come back in 4-6 wks if no improvement. You are doing great with your anxiety. Keep up the good work.

## 2011-02-15 NOTE — Progress Notes (Signed)
Subjective:    Patient ID: Jennifer Mendoza, female    DOB: 08/05/1976, 35 y.o.   MRN: [id number]  HPI Depression/Anxiety/Agoraphobia: 35 y/o F with history of anxiety disorder and depression stemming from lost of daughter at age 21-wks in 2008.  She has made improvement in leaving the house and not becoming distressed in public.  At last visit Jennifer Mendoza was distressed about her teenage daughter and her issues with bullies at school.  Pt states today that she feels much better than at last appt.  She feels the best she has in a long time.  When discussing returning to work part time, pt states that she feels almost ready to attempt going back to work, but she is concerned about her 3-yo son, Jennifer Mendoza.  She has been anxious in leaving him with other people.  She states that he becomes distressed when they are apart.  She is concerned that he is "becoming like her" because when they go out to other people's homes or parties he always ask to go home right away.  We discussed [employer] because pt is interested in starting [place] there.  Her 2 other children have benefited from participating in [employer].  She has conflicting feelings in that she wants him to attend [employer] but is afraid to let him go.   She is sleeping 6-7 hrs/night.  Feels that is adequate amt of rest.  Denies SI/HI. She tried going to [facility], but has stopped because she did not find it helpful.  At last visit I referred pt to Arletta Bale, but pt has not made contact.  Pt states that it is difficult for her to find someone to watch Jennifer Mendoza so that she can go to appts.  She tried to leave him with her mother, but Jennifer Mendoza becomes very tearful and distressed.   Med: Klonopin 0.5mg  AM and QHS, Xanax 0.5mg  once a day, Paxil 3.75mg  daily.   Tobacco use:  smokes 5-7/day.  Not interesting in quiting. No alcohol  R knee pain: Present for about 1 month.  Worse after she has been standing for about an hour or so, like when she is washing  dishes.  Feels like shooting pain that lasts the whole time she is doing dishes.  Getting off her feet helps.  During these episodes flexion causes pain.  Not sure about edema.  No injury.  No locking.  No slipping. No fever, chills, redness.  Has not tried any medicine for pain.      Review of Systems Per hpi     Objective:   Physical Exam  General:  Well-developed,well-nourished,in no acute distress; alert,appropriate and cooperative throughout examination. vitals reviewed. Lungs:  Normal respiratory effort, chest expands symmetrically. Lungs are clear to auscultation, no crackles or wheezes. Heart:  Normal rate and regular rhythm. S1 and S2 normal without gallop, murmur, click, rub or other extra sounds. Extremities:  No clubbing, cyanosis, edema. R knee: Inspection with no erythema.  +Mild effusion.  Palpation showed mild joint line tenderness and patellar tenderness.  Hyperextension of R knee causes tenderness. Ligaments with solid consistent endpoints including ACL, PCL, LCL, MCL. Negative McMurray's. Patellar and quadirceps tendon unremarkable.  Hamstring and quadriceps strength is normal.  Neurologic:  alert & oriented X3.   Psych:  Oriented X3, good eye contact, not suicidal, not homicidal, happy affect, not anxious. Good interaction with me.  She smiled at times.  Good eye contact.  Speech not pressured.   Assessment & Plan:

## 2011-02-15 NOTE — Assessment & Plan Note (Signed)
R knee pain with standing (weight baring) while doing dishes.  She denies any injury.  Exam showed mild effusion.  Ligaments stable.  No crepitus.  Tenderness to palpation of patella and joint line.  Will try NSAID (Ibuprofen) first.  Offered steroid injection, but pt declined.

## 2011-02-15 NOTE — Assessment & Plan Note (Signed)
Doing well on current regimen of Xanax 0.5mg  daily prn and Klonopin 0.5mg  bid.  I am trying to encourage pt to return to work part time.  I think she would benefit from interaction with adults as currently she is spending most of her time with her 3-yo son, Jennifer Mendoza.  She did go back to in 2011 for a few months and was very proud of herself for that.  I am following her lead in allowing Jennifer Mendoza to attend [employer].  She can use the time that Jennifer Mendoza is at [place] to return to work.  Pt will think about this until next visit.  Also, I have tried again to encourage pt to make appt with therapist, Letha Cape.  We discussed the benefit of both therapy and medication to deal with depression/anxiety.

## 2011-02-15 NOTE — Assessment & Plan Note (Signed)
Paxil 37.5mg  daily.  I am trying to encourage pt to return to work part time.  I think she would benefit from interaction with adults as currently she is spending most of her time with her 3-yo son, Jennifer Mendoza.  She did go back to in 2011 for a few months and was very proud of herself for that.  I am following her lead in allowing Jennifer Mendoza to attend [employer].  She can use the time that Jennifer Mendoza is at [place] to return to work.  Pt will think about this until next visit.  Also, I have tried again to encourage pt to make appt with therapist, Letha Cape.  We discussed the benefit of both therapy and medication to deal with depression/anxiety.

## 2011-02-17 ENCOUNTER — Telehealth: Payer: Self-pay | Admitting: Family Medicine

## 2011-02-17 LAB — URINE CULTURE: Colony Count: NO GROWTH

## 2011-02-17 LAB — POCT URINALYSIS DIP (DEVICE)
Bilirubin Urine: NEGATIVE
Nitrite: NEGATIVE
Protein, ur: NEGATIVE mg/dL

## 2011-02-17 LAB — POCT PREGNANCY, URINE: Preg Test, Ur: NEGATIVE

## 2011-02-17 NOTE — Telephone Encounter (Signed)
Pt seen yesterday for her knee problem, says its worse today even after taking advil & pt wants to know if MD would refer her to an ortho doctor?

## 2011-02-17 NOTE — Telephone Encounter (Signed)
To Ta Anuel Sitter, Maryjo Rochester

## 2011-02-18 NOTE — Telephone Encounter (Signed)
Called pt back. Advised trying ice and ibuprofen for swelling. If knee still hurting we can xray next. She can come to [facility] if still hurting in a couple of wks. Pt agreed to plan.

## 2011-04-13 NOTE — Discharge Summary (Signed)
NAME:  Jennifer Mendoza, Jennifer Mendoza                ACCOUNT NO.:  [id number]   MEDICAL RECORD NO.:  [id number]          PATIENT TYPE:  INP   LOCATION:                                FACILITY:  [facility]   PHYSICIAN:  Ginger Carne, MD  DATE OF BIRTH:  Dec 15, 1975   DATE OF ADMISSION:  10/01/2007  DATE OF DISCHARGE:  10/03/2007                               DISCHARGE SUMMARY   REASON FOR HOSPITALIZATION:  A 31-6/[redacted] weeks gestation, vaginal bleeding.   IN-HOSPITAL PROCEDURES:  Bed rest, tocolysis, and obstetrical  sonography.   FINAL DIAGNOSIS:  A 31-6/[redacted] weeks gestation, vaginal bleeding, unknown  etiology.   HOSPITAL COURSE:  This is a 35 year old Caucasian female, gravida 6,  para 2-1-2-2.  Estimated date of confinement November 27, 2007 at 31-6/[redacted]  weeks gestation on admission with a history of intrauterine growth  retardation and presentation of vaginal bleeding.  The patient was  admitted to the hospital and noted to be approximately 1 cm, 70% minus  station on admission, and advanced to 3 cm 70% effacement and minus 1  station.  She was placed initially on Procardia and magnesium sulfate  which was discontinued the morning of October 02, 2007.  Her contraction  pattern had abated following the onset of bleeding. She was placed on a  dexamethasone protocol 6 mg IM q.12 h. for four doses.  During the  course of her stay, her contractions had stopped; and on discharge was  noted to only have slight blood tinged mucus.   Her cervix was 1 cm, 40% effaced, minus 2 station, without palpable  patient perceived, or monitor demonstrating contraction pattern.  Fetal  heart rate pattern was class 1 without evidence of decelerations.   The patient lives 30-minutes away from the hospital and has unreliable  transportation apart from 9-1-1.  She was offered to stay for the  remainder of her pregnancy until about [redacted] weeks gestation which she  declined.  Further discussion with the patient centered around  contacting daily potential family or friends who could take her to the  hospital in the event of heavier bleeding or contraction pattern.  She  understands that she can always call 9-1-1 if she has no mode of  transportation.   The patient was advised to return to the maternal admission unit for  increased vaginal bleeding, contractions, or abdominal pain.  She is  scheduled for her next high-risk clinic visit on the 17th or 18th of  November 2008.  She is A+ blood type.   MEDICATIONS:  She will continue her home medications which included:  1. Prenatal vitamins.  2. Klonopin 1 mg twice a day as needed.  3. BuSpar 30 mg two a day.  4. She was NOT sent home with Procardia.   All questions answered to the satisfaction of the patient, and patient  verbalized understanding of the same.      Ginger Carne, MD  Electronically Signed     SHB/MEDQ  D:  10/03/2007  T:  10/03/2007  Job:  [id number]

## 2011-04-13 NOTE — Consult Note (Signed)
NAME:  Jennifer Mendoza, Jennifer Mendoza                ACCOUNT NO.:  [id number]   MEDICAL RECORD NO.:  [id number]          PATIENT TYPE:  OBV   LOCATION:  9155                          FACILITY:  [facility]   PHYSICIAN:  Toma Copier, MD           DATE OF BIRTH:  1976/08/13   DATE OF CONSULTATION:  DATE OF DISCHARGE:                                 CONSULTATION   PHYSICIAN REQUESTING CONSULTATION:  Johnella Moloney, MD   REASON FOR CONSULTATION:  Oligohydramnios, concern for rupture of  membranes.   Dear Dr. Silas Flood:   Thank you for allowing me to participate in the care of Ms. Lindo.  You  know her as a 35 year old G6, P2-1-2-2, who is currently at 8-24 weeks'  gestation, admitted secondary to oligohydramnios and evaluation for  possible rupture of membranes.   The patient reports complaints of some whitish fluid per vagina and  lower abdominal pain for approximately 2 weeks.  Unsure about fevers and  reports some occasional chills but denies any vomiting, diarrhea or  regular uterine contractions.  She also denies any vaginal bleeding.  She is admitted on August 03, 2007, and an ultrasound evaluation at  that time reveals low fluid, AFI of approximately 9 cm.  A repeat  aminotic fluid evaluation on September 5 reveals an AFI of 10.2 cm,  which is at the 9th percentile.  She is otherwise without complaints.   PAST OBSTETRICAL HISTORY:  Notable for G1, TAB.  G2, 32-week SVD post  preterm labor, also complicated by questionable IUGR.  The patient was  induced secondary to the IUGR.  G3, full-term SVD, no complications.  G4, SAB.  G5 in October 2007 was a full-term SVD, and this baby died at  57 weeks of age.   PAST MEDICAL HISTORY:  The patient has had a history of pyelonephritis  as well as kidney stones, for which she was treated with a ureteral  stent.  Her past medical history is also notable for anxiety disorder  and panic attacks and thus has also been on long-term use of  benzodiazepines and currently takes  Klonopin and BuSpar.  She also has a  history notable for gestational diabetes, diet-controlled, in her first  pregnancy.   MEDICATIONS:  Klonopin, prenatal vitamins and BuSpar.   ALLERGIES:  No known drug allergies.   GYNECOLOGIC HISTORY:  Negative for any abnormal Pap smears.   SOCIAL HISTORY:  Positive smoker.  Denies any alcohol or drugs of abuse.   FAMILY HISTORY:  Noncontributory.   PRENATAL LABORATORY DATA:  Blood type A positive, antibody screen  negative.  Hemoglobin 14.6, platelets 262,000.  Rubella immune.  Hepatitis B surface antigen negative.  Syphilis nonreactive.  HIV  nonreactive.  Gonorrhea and chlamydia negative.  She does have a history  of GBS positivity in her 2007 pregnancy.   Ultrasound findings from this morning, September 5, via radiology  services reveals vertex presentation.  Amniotic fluid index 10.2, which  is at the 9th percentile.  Gestational age is supposed to be 23 weeks 4  days.  Cervical length  is 3.1 cm.   PHYSICAL FINDINGS:  She remains afebrile since admission.  Other vital  signs are stable.  ABDOMEN:  Soft, gravid, mild tender to palpation on the right lower  quadrant but otherwise no guarding, no rebound tenderness is noted.  Fetal heart tracings are 140s.  Tocodynamometry does not reveal any  contractions.  Per patient report, a speculum exam was done yesterday without any gross  evidence of rupture of membranes.   Laboratory findings today reveal that her white count is 7.9, hemoglobin  10.2, hematocrit 29.0, platelet count 194,000.  Urinalysis was negative  as of yesterday.  C-reactive protein is pending.   ASSESSMENT AND PLAN:  Discussed options related to the oligohydramnios  and the primary team's concern for possible rupture of membranes as well  as possible chorioamnionitis.  We discussed definitive diagnosis with an  amniocentesis and indigo carmine study, which would provide [place]  information related to rupture of membranes  as well as evaluating the  amniotic fluid for infectious source.  After addressing the risks and  benefits, the patient has decided not to proceed with this procedure.  In addition, she has remained afebrile and no evidence of uterine  contractions, no increase in white count, and no notable fetal  tachycardia.  Subsequent to this, we also discussed a management plan,  which would include IV hydration, modified bedrest activity, and  reevaluation of amniotic fluid next week at 24 weeks' gestation.  She  seemed agreeable to this plan, and all of her questions were answered.  These findings were reported to Dr. Yetta Barre and Ms. Sh[place], Fish farm manager.  We are readily available to reevaluate her on Monday  morning in the outpatient setting.  She was also provided instructions  for preterm labor and fever precautions and instructed that if she has  any fevers greater than 100.4 or any evidence of labor or other rupture  of membranes, to report to the emergency room for further evaluation.   A total of 30 minutes face-to-face time was provided during this  consultation.   Thank you very much for allowing me to participate.           ______________________________  Toma Copier, MD     SJ/MEDQ  D:  08/04/2007  T:  08/04/2007  Job:  [id number]

## 2011-04-13 NOTE — Discharge Summary (Signed)
NAME:  Jennifer Mendoza, Jennifer Mendoza                ACCOUNT NO.:  [id number]   MEDICAL RECORD NO.:  [id number]          PATIENT TYPE:  INP   LOCATION:  9107                          FACILITY:  [facility]   PHYSICIAN:  Karlton Lemon, MD      DATE OF BIRTH:  07-11-1976   DATE OF ADMISSION:  10/27/2007  DATE OF DISCHARGE:                               DISCHARGE SUMMARY   DATE OF ADMISSION:  October 27, 2007.   DATE OF DISCHARGE:  October 29, 2007.   ADMISSION DIAGNOSES:  1. Intrauterine pregnancy 35 weeks 4 days.  2. Placental abruption.  3. Oligohydramnios  4. History of preterm labor and preterm delivery.  5. Panic disorder.   DISCHARGE DIAGNOSES:  1. Postpartum day#2 from spontaneous vaginal delivery of a viable      infant female weighing 4 pounds 10 ounces with Apgars of 8 at 1      minute and 8 at 5 minutes.  2. History of panic disorder.   PROCEDURES:  The patient had an ultrasound performed on October 27, 2007, showing a viable intrauterine gestation in cephalic presentation.  Posterior placenta above the cervical os and a abnormally thickened  placenta with relative hypoechogenicity along the superior edge of the  placenta that was worrisome for abruption.   COMPLICATIONS:  None.   CONSULTATIONS:  None.   PERTINENT LABORATORY FINDINGS:  Ms. Jennifer Mendoza had a admission CBC with white  blood cell count of 10.2, hemoglobin 11.7, hematocrit 33.8, platelets  217,000.  The admission, DIC panel was drawn with PT 12.9, INR 1, PTT  26, fibrinogen 367, D-dimer 1.71.  Admission drug screen was negative.  RPR was nonreactive.  She had a CBC on postpartum day#1, with some white  blood cell count of 0.9, hemoglobin level 11, hematocrit 32, platelets  208,000.   BRIEF ADMISSION HISTORY:  Ms. Jennifer Mendoza is a gravida 6 para 2-1-2-2, 35 weeks  4 days gestation is followed in high risk clinic for history of preterm  labor and preterm deliveries.  She presented to the [facility]U with complaints  of heavy vaginal bleeding.   She was assessed and found to have a  placental abruption on ultrasound.  At that time, fetal heart tones were  nonreassuring.  It was determined she should be induced for placental  abruption and history of oligohydramnios.   HOSPITAL COURSE:  The patient was admitted, TBS unknown and dating was  from 37 weeks.  She was also started on Pitocin to augment her labor  issues initially to 2 cm dilated.  The patient progressed to delivery of  a viable infant female weighing 4 pounds 10 ounces with Apgars were 8 at 1  minute, 8 at 5 minutes on October 27, 2007, 22:38 in the evening via  spontaneous vaginal delivery.  There were no lacerations.  Estimated  blood loss was 300 ml.  The infant was transferred to the NICU for  higher level of care.  The patient did well post partum, was ambulating,  tolerating p.o.  She denied chest pain ,shortness of breath.  She was  voiding and was  passing gas, but had no stool prior to discharge.  She  states that her abdominal pain had improved as well as bleeding.  At  time of discharge, her physical exam was normal with stable vitals.   She is to discharged in stable condition.   DISCHARGE STATUS:  Stable.   DISCHARGE MEDICATIONS:  1. Prenatal vitamins 1 tablet daily.  2. Colace 100 mg 1 tablet by mouth twice daily.  3. Motrin 600 mg 1 tablet by mouth every 6 hours as needed for pain.   DISCHARGE INSTRUCTIONS:  1. Discharge to home.  2. Regular diet.  3. No sex x 6 weeks otherwise activity as tolerated.  4. Patient is to follow up for Implanon for birth control.  The      patient does not desire oral contraceptive pills or Depo in the      mean time.  5. The patient is to follow up with [facility] Department      in 6 weeks for postpartum examination.      Karlton Lemon, MD  Electronically Signed     NS/MEDQ  D:  10/29/2007  T:  10/29/2007  Job:  [id number]

## 2011-04-13 NOTE — Discharge Summary (Signed)
NAME:  Jennifer Mendoza, Jennifer Mendoza                ACCOUNT NO.:  [id number]   MEDICAL RECORD NO.:  [id number]          PATIENT TYPE:  OBV   LOCATION:  9155                          [facility]ACILITY:  [facility]   PHYSICIAN:  Phil D. Okey Mendoza, M.D.     DATE OF BIRTH:  [birth year]   DATE OF ADMISSION:  08/03/2007  DATE OF DISCHARGE:  08/04/2007                               DISCHARGE SUMMARY   ADMISSION DIAGNOSIS:  Oligohydramnios, question ruptured membranes,  question chorioamnionitis.   DISCHARGE DIAGNOSIS:  Oligohydramnios.   HISTORY OF PRESENT ILLNESS:  Patient is a 35 year old G6 P2-1-2-2 who  presented at 23-3/7 weeks.  Patient was initially seen at high risk  clinic with report of chills and abdominal pain and was admitted for  possible concern of chorioamnionitis.  No other symptoms.   PAST MEDICAL HISTORY:  1. Anxiety.  2. History of kidney stones.   PAST SURGICAL HISTORY:  Ureteral stent secondary to kidney stones.   OB HISTORY:  1. One TAB.  2. One spontaneous abortion.  3. One preterm delivery at 32 weeks.  4. One full term delivery at 38 weeks, complicated by GDM.   GYNECOLOGICAL HISTORY:  Noncontributory.   MEDICATIONS:  Klonopin, prenatal vitamins.   ALLERGIES:  No known drug allergies.   PRENATAL LAB RESULTS:  A positive, antibody negative, rubella immune,  hepatitis antigen negative, RPR nonreactive, HIV nonreactive, GC  negative and Chlamydia negative.   ADMISSION EXAMINATION:  Temperature 98.2, pulse 88, respirations 18,  blood pressure 92/50.  Fetal heart rate 145 with marked variability and accelerations, no  decelerations, no contractions noted on tocometer.  GENERAL:  No apparent distress.  HEART:  Regular rate and rhythm.  LUNGS:  Clear to auscultation bilaterally.  ABDOMEN:  Soft, gravid, moderate tenderness to palpation in mid abdomen.  Positive bowel sounds, no rebound, no CVA tenderness.  EXTREMITIES:  No tenderness, no edema.   LABS:  White blood cell count of 7.   Urinalysis negative.  Ultrasound:  AFI 10.2, cervix length 3.12 cm.   HOSPITAL COURSE:  During her evaluation on the antenatal service,  patient remained afebrile, her white blood cell count was 7, she had a  negative urinalysis and no other signs of infection.  Given patient's  vague complaint of possible leaking fluid, she was also evaluated but an  ultrasound done on September 5th showed an AFI of 10.  Given this  question of possible rupture, a Maternal Fetal Medicine consult was  obtained regarding the option of amniocentesis with tampon test as well  as evaluation for chorioamnionitis.  After counseling, patient declined  this procedure given the risks.  Since the patient denied any  contractions, leaking of fluid, chills, and also had been afebrile  during her observation, the recommendation was to hydrate with IV fluids  and discharge home with plans to followup on the 08/07/07 with another  ultrasound to evaluate her amniotic fluid volume.  Patient agreed with  the plan.  She was discharged to home in stable condition.   DISCHARGE INSTRUCTIONS:  Patient was told to call for any preterm labor  symptoms or fever or any other signs of infection.  Patient was also  instructed to follow up at a scheduled appointment August 07, 2007, at  1:45 p.m. for a repeat ultrasound.  Patient verbalized understanding and  agreement with this plan.      Norton Blizzard, MD  Electronically Signed     ______________________________  Jennifer Mendoza, M.D.    UAD/MEDQ  D:  08/04/2007  T:  08/04/2007  Job:  [id number]

## 2011-05-17 ENCOUNTER — Encounter: Payer: Self-pay | Admitting: Family Medicine

## 2011-06-08 ENCOUNTER — Ambulatory Visit (INDEPENDENT_AMBULATORY_CARE_PROVIDER_SITE_OTHER): Payer: Medicaid Other | Admitting: Family Medicine

## 2011-06-08 ENCOUNTER — Encounter: Payer: Self-pay | Admitting: Family Medicine

## 2011-06-08 DIAGNOSIS — F411 Generalized anxiety disorder: Secondary | ICD-10-CM

## 2011-06-08 NOTE — Patient Instructions (Signed)
Get an appointment with therapist to discuss

## 2011-06-09 ENCOUNTER — Telehealth: Payer: Self-pay | Admitting: Family Medicine

## 2011-06-09 MED ORDER — LORAZEPAM 0.5 MG PO TABS: 0.5000 mg | ORAL_TABLET | Freq: Three times a day (TID) | ORAL | Status: DC | PRN

## 2011-06-09 MED ORDER — BUSPIRONE HCL 10 MG PO TABS: 10.0000 mg | ORAL_TABLET | Freq: Two times a day (BID) | ORAL | Status: AC

## 2011-06-09 MED ORDER — ALPRAZOLAM ER 0.5 MG PO TB24: 1.0000 mg | ORAL_TABLET | ORAL | Status: AC

## 2011-06-09 NOTE — Telephone Encounter (Signed)
Received call yesterday that prescriptiions were ready but when she got there was only the one for Buspar but was missing Ativan.  She would like a call back.

## 2011-06-09 NOTE — Telephone Encounter (Signed)
Called pt and told her that benzo rx must be picked up at front desk.  Pt states understanding

## 2011-06-10 NOTE — Assessment & Plan Note (Signed)
Will change pt back to buspar in conjunction with paxil.  Although this isn't a typical combination, i think it is important to restart buspar since pt seems to think that she did better on this.  I also think it is important for pt to have SSRI in regimen.  Will change pt back to xanax but will do extended release once daily dosing. Pt encouraged to seek out therapy appointment.  Pt to return to care in 2 weeks.  Spent 30 minutes with pt.  Over 1/2 of visit spent in couseling.

## 2011-06-10 NOTE — Progress Notes (Signed)
  Subjective:    Patient ID: Jennifer Mendoza, female    DOB: 1976/08/19, 35 y.o.   MRN: [id number]  HPI Anxiety/agoraphobia: Pt here to discuss medications.   Pt states she was recently switched from buspar and xanax to paxil and klonopin.  She states that she doesn't feel as good.  Her anxiety is not as controlled on this new regimen.  Klonopin makes her feel dizzy.  Has long history of anxiety.  Experienced death of a child which she states caused her anxiety to be worse.  Has problems sleeping.  Lots of stress associated around going out in public.  States that once she gets out of the house she does feel better.  Has not been in therapy recently.  States that this wasn't helpful in the past.   No fever. No palpitations. No SI or HI.    Review of Systems As per above    Objective:   Physical Exam  Constitutional: She appears well-developed and well-nourished.  HENT:  Head: Normocephalic and atraumatic.  Cardiovascular: Normal rate.   Pulmonary/Chest: Effort normal.  Psychiatric: Her speech is normal. Judgment and thought content normal. Cognition and memory are normal.       Blunted affect.  Mild anxiousness in behaviour.            Assessment & Plan:

## 2011-06-22 ENCOUNTER — Ambulatory Visit: Payer: Medicaid Other | Admitting: Family Medicine

## 2011-07-14 ENCOUNTER — Ambulatory Visit (INDEPENDENT_AMBULATORY_CARE_PROVIDER_SITE_OTHER): Payer: Medicaid Other | Admitting: Family Medicine

## 2011-07-14 DIAGNOSIS — F411 Generalized anxiety disorder: Secondary | ICD-10-CM

## 2011-07-14 MED ORDER — ALPRAZOLAM ER 1 MG PO TB24: 1.0000 mg | ORAL_TABLET | ORAL | Status: DC

## 2011-07-14 MED ORDER — ALPRAZOLAM ER 0.5 MG PO TB24: 1.0000 mg | ORAL_TABLET | ORAL | Status: DC

## 2011-07-14 MED ORDER — FAMOTIDINE 40 MG PO TABS: 40.0000 mg | ORAL_TABLET | Freq: Every day | ORAL | Status: DC

## 2011-07-14 NOTE — Patient Instructions (Signed)
Return in 1 month for follow up. Consider seeing a counselor to help with management- or trying free grief counseling at hospice to see if this helps.

## 2011-07-14 NOTE — Progress Notes (Signed)
  Subjective:    Patient ID: Jennifer Mendoza, female    DOB: 02-17-1976, 35 y.o.   MRN: [id number]  HPI Anxiety: Pt states that she is doing "ok".  She feels like her current medication regimen is ok but she continues to have issues with panic and anxiety.   Panic usually out in public.  Recently had to be in the hospital with her son who was sick so this was very difficult for her.  Even at home she has gets very nervous when having to make phone calls.  Pt states that she has a certain level of anxiety that she has every day but that at certain times of the day she will have increased anxiety.  It is during those times that she wishes that she had a stronger, more quick acting anti-anxiety medicine.  Has not seen therapist.  States she is resistant to therapy b/c she doesn't think that it will really help.  It hasn't in the past.    Review of Systems No changes in appetite.  No weight changes.  No recent illness.  No SI or HI.     Objective:   Physical Exam  Constitutional: She is oriented to person, place, and time. She appears well-developed and well-nourished.  Cardiovascular: Normal rate.   Pulmonary/Chest: Effort normal. No respiratory distress.  Neurological: She is alert and oriented to person, place, and time.  Skin: No rash noted.  Psychiatric: Judgment and thought content normal.       Flat affect, thoughts well organized          Assessment & Plan:

## 2011-07-20 NOTE — Assessment & Plan Note (Signed)
Will continue paxil CR 37.5mg  that is at max dose as indicated for anxiety.  Will continue buspar.  Also will continue xanax XR 1mg  daily.  Pt agreed to pursue therapy to complement medical treatment. We discussed that it has been years since she has tried therapy, now that she has more life experience and is in a different place in life, therapy may be more successful.  We discussed that therapy efficacy is really dependent on how willing the pt is to engage in therapy.  Pt states understanding.

## 2011-07-23 ENCOUNTER — Ambulatory Visit: Payer: Medicaid Other | Admitting: Family Medicine

## 2011-09-06 ENCOUNTER — Ambulatory Visit (INDEPENDENT_AMBULATORY_CARE_PROVIDER_SITE_OTHER): Payer: Self-pay | Admitting: Family Medicine

## 2011-09-06 ENCOUNTER — Encounter: Payer: Self-pay | Admitting: Family Medicine

## 2011-09-06 VITALS — BP 172/80 | HR 80 | Temp 98.1°F | Ht 65.0 in | Wt 140.4 lb

## 2011-09-06 DIAGNOSIS — J3489 Other specified disorders of nose and nasal sinuses: Secondary | ICD-10-CM | POA: Insufficient documentation

## 2011-09-06 DIAGNOSIS — R300 Dysuria: Secondary | ICD-10-CM

## 2011-09-06 DIAGNOSIS — N200 Calculus of kidney: Secondary | ICD-10-CM

## 2011-09-06 LAB — POCT URINALYSIS DIPSTICK
Nitrite, UA: NEGATIVE
Protein, UA: NEGATIVE
Spec Grav, UA: 1.015
Urobilinogen, UA: 0.2

## 2011-09-06 LAB — POCT UA - MICROSCOPIC ONLY

## 2011-09-06 LAB — POCT URINE PREGNANCY: Preg Test, Ur: NEGATIVE

## 2011-09-06 MED ORDER — MELOXICAM 15 MG PO TABS: 15.0000 mg | ORAL_TABLET | Freq: Every day | ORAL | Status: AC

## 2011-09-06 MED ORDER — TAMSULOSIN HCL 0.4 MG PO CAPS: 0.4000 mg | ORAL_CAPSULE | Freq: Every day | ORAL | Status: DC

## 2011-09-06 MED ORDER — HYDROCODONE-ACETAMINOPHEN 5-325 MG PO TABS: 1.0000 | ORAL_TABLET | ORAL | Status: DC | PRN

## 2011-09-06 NOTE — Assessment & Plan Note (Signed)
Colicky symptoms likely recurrent stone- has history of stent in past- no records.  Will give meloxicam and hydrocodone for prn pain.  Flomax to help facilitate passage.  If continues would consider CT abdomen to further evaluate.

## 2011-09-06 NOTE — Assessment & Plan Note (Signed)
Likely viral mediated.  One day in duration.  Advised against abx, gave her choosing wisely handout and recommended Claritin + D, sinus rinse.  Gave her red flags for return.

## 2011-09-06 NOTE — Progress Notes (Signed)
  Subjective:    Patient ID: Jennifer Mendoza, female    DOB: [birth year], 35 y.o.   MRN: [id number]  HPI 3 days of right flank pain:  Consistent with pain of previous kidney stones.  Had history of stent in 2001. No dysuria, but pain when she urinates- like something pushing out of urethra.  No hematuria.  No fever, emesis, vomiting.  Does not think she has passed a stone.  + urinary frequency.No vaginal discharge.  Also has a history of low back problems.  Sinus infection for 1 day:  Pressure behind eyes, face feels heavy.  Nasal congestion.  Taking ibuprofen  6 tabs a day.  + cough with phlegm.  Not taking any antihistamines or decongestants. Review of Systems See above  I have reviewed patient's  PMH, FH, and Social history and Medications as related to this visit.     Objective:   Physical Exam  GEN: Alert & Oriented, No acute distress.  Uncomfortable appearing. CV:  Regular Rate & Rhythm, no murmur Respiratory:  Normal work of breathing, CTAB Abd:  + BS, soft, no tenderness to palpation Right flank pain.  Midline SI pain. Ext: no pre-tibial edema       Assessment & Plan:

## 2011-09-06 NOTE — Patient Instructions (Signed)
Meloxicam- a stronger NSAID like ibuprofen- take with food Hydrocodone-acetaminophen: for severe pain Flomax- to help stone pass  Return if severe pain, no passage and continued pain in 1-2 weeks, fever, emesis, or other concerns

## 2011-09-07 LAB — DIC (DISSEMINATED INTRAVASCULAR COAGULATION)PANEL
D-Dimer, Quant: 1.71 — ABNORMAL HIGH
D-Dimer, Quant: 19.27 — ABNORMAL HIGH
Fibrinogen: 396
Platelets: 221
Platelets: 213
Prothrombin Time: 12.9
Prothrombin Time: 12.7
aPTT: 25

## 2011-09-07 LAB — URINALYSIS, ROUTINE W REFLEX MICROSCOPIC
Bilirubin Urine: NEGATIVE
Glucose, UA: NEGATIVE
Glucose, UA: NEGATIVE
Hgb urine dipstick: NEGATIVE
Ketones, ur: NEGATIVE
Leukocytes, UA: NEGATIVE
Nitrite: NEGATIVE
Nitrite: NEGATIVE
Nitrite: NEGATIVE
Protein, ur: NEGATIVE
Protein, ur: NEGATIVE
Specific Gravity, Urine: 1.015
Urobilinogen, UA: 0.2
Urobilinogen, UA: 0.2
pH: 7

## 2011-09-07 LAB — STREP B DNA PROBE: Strep Group B Ag: NEGATIVE

## 2011-09-07 LAB — COMPREHENSIVE METABOLIC PANEL
ALT: 11
Albumin: 2.5 — ABNORMAL LOW
Alkaline Phosphatase: 116
Calcium: 8.6
GFR calc Af Amer: >60
Potassium: 3.6
Sodium: 137
Total Protein: 5.7 — ABNORMAL LOW

## 2011-09-07 LAB — CBC
Hemoglobin: 11.7 — ABNORMAL LOW
MCHC: 34.4
MCHC: 34.6
MCHC: 34.6
MCHC: 35.1
MCV: 92.4
MCV: 91.7
MCV: 91.8
Platelets: 208
Platelets: 170
Platelets: 196
Platelets: 201
RBC: 3.49 — ABNORMAL LOW
RBC: 3.66 — ABNORMAL LOW
RBC: 3.18 — ABNORMAL LOW
RBC: 3.35 — ABNORMAL LOW
RDW: 13.6
WBC: 10.2
WBC: 13.4 — ABNORMAL HIGH
WBC: 14.1 — ABNORMAL HIGH

## 2011-09-07 LAB — URINE MICROSCOPIC-ADD ON

## 2011-09-07 LAB — WET PREP, GENITAL: Trich, Wet Prep: NONE SEEN

## 2011-09-07 LAB — POCT URINALYSIS DIP (DEVICE)
Bilirubin Urine: NEGATIVE
Bilirubin Urine: NEGATIVE
Glucose, UA: NEGATIVE
Ketones, ur: NEGATIVE
Ketones, ur: NEGATIVE
Operator id: 159681
Specific Gravity, Urine: >=1.03
Specific Gravity, Urine: 1.015
Urobilinogen, UA: 1
pH: 6.5

## 2011-09-07 LAB — RAPID URINE DRUG SCREEN, HOSP PERFORMED
Amphetamines: NOT DETECTED
Barbiturates: NOT DETECTED
Barbiturates: NOT DETECTED
Benzodiazepines: POSITIVE — AB
Benzodiazepines: POSITIVE — AB
Cocaine: NOT DETECTED

## 2011-09-07 LAB — GC/CHLAMYDIA PROBE AMP, GENITAL
Chlamydia, DNA Probe: NEGATIVE
GC Probe Amp, Genital: NEGATIVE

## 2011-09-08 LAB — URINALYSIS, ROUTINE W REFLEX MICROSCOPIC
Glucose, UA: NEGATIVE
Protein, ur: NEGATIVE
Specific Gravity, Urine: 1.015
Urobilinogen, UA: 0.2

## 2011-09-08 LAB — FETAL FIBRONECTIN: Fetal Fibronectin: NEGATIVE

## 2011-09-08 LAB — GC/CHLAMYDIA PROBE AMP, GENITAL: GC Probe Amp, Genital: NEGATIVE

## 2011-09-08 LAB — POCT URINALYSIS DIP (DEVICE)
Glucose, UA: NEGATIVE
Glucose, UA: NEGATIVE
Nitrite: NEGATIVE
Nitrite: NEGATIVE
Operator id: 135281
Operator id: 297281
Specific Gravity, Urine: 1.02
Urobilinogen, UA: 0.2
Urobilinogen, UA: 2 — ABNORMAL HIGH

## 2011-09-08 LAB — URINE MICROSCOPIC-ADD ON

## 2011-09-09 LAB — POCT URINALYSIS DIP (DEVICE)
Bilirubin Urine: NEGATIVE
Glucose, UA: NEGATIVE
Glucose, UA: NEGATIVE
Hgb urine dipstick: NEGATIVE
Hgb urine dipstick: NEGATIVE
Ketones, ur: NEGATIVE
Nitrite: NEGATIVE
Nitrite: NEGATIVE
Operator id: 148111
Protein, ur: NEGATIVE
Specific Gravity, Urine: 1.015
Urobilinogen, UA: 1
pH: 7

## 2011-09-10 LAB — URINALYSIS, ROUTINE W REFLEX MICROSCOPIC
Nitrite: NEGATIVE
Specific Gravity, Urine: 1.02
Urobilinogen, UA: 0.2

## 2011-09-10 LAB — RAPID URINE DRUG SCREEN, HOSP PERFORMED
Amphetamines: NOT DETECTED
Cocaine: NOT DETECTED
Tetrahydrocannabinol: NOT DETECTED

## 2011-09-10 LAB — WET PREP, GENITAL: Clue Cells Wet Prep HPF POC: NONE SEEN

## 2011-09-10 LAB — C-REACTIVE PROTEIN: CRP: 0.6 — ABNORMAL HIGH (ref <0.6)

## 2011-09-10 LAB — CBC
HCT: 29 — ABNORMAL LOW
Hemoglobin: 10.2 — ABNORMAL LOW
Hemoglobin: 11.1 — ABNORMAL LOW
RBC: 3.14 — ABNORMAL LOW
RBC: 3.4 — ABNORMAL LOW
RDW: 13.4
RDW: 13.5

## 2011-09-10 LAB — STREP B DNA PROBE

## 2011-09-15 ENCOUNTER — Ambulatory Visit (INDEPENDENT_AMBULATORY_CARE_PROVIDER_SITE_OTHER): Payer: Self-pay | Admitting: Family Medicine

## 2011-09-15 ENCOUNTER — Encounter: Payer: Self-pay | Admitting: Family Medicine

## 2011-09-15 VITALS — BP 101/72 | HR 78 | Ht 65.0 in | Wt 140.0 lb

## 2011-09-15 DIAGNOSIS — F410 Panic disorder [episodic paroxysmal anxiety] without agoraphobia: Secondary | ICD-10-CM

## 2011-09-15 DIAGNOSIS — R300 Dysuria: Secondary | ICD-10-CM

## 2011-09-15 DIAGNOSIS — F411 Generalized anxiety disorder: Secondary | ICD-10-CM

## 2011-09-15 LAB — POCT URINALYSIS DIPSTICK
Ketones, UA: NEGATIVE
Protein, UA: NEGATIVE
Spec Grav, UA: 1.01
Urobilinogen, UA: 0.2
pH, UA: 6

## 2011-09-15 LAB — POCT UA - MICROSCOPIC ONLY

## 2011-09-15 MED ORDER — CLONAZEPAM 0.5 MG PO TABS: 0.5000 mg | ORAL_TABLET | Freq: Two times a day (BID) | ORAL | Status: DC | PRN

## 2011-09-15 NOTE — Patient Instructions (Addendum)
Urinary symptoms: We are going to recheck your urine today.   Anxiety: Make an appointment with Dr. Pascal Lux.  I will switch you back to klonopin from xanan xr. Since you felt you did better with this.   Return to f/up with me for recheck in 1 month.

## 2011-09-15 NOTE — Progress Notes (Signed)
  Subjective:    Patient ID: Jennifer Mendoza, female    DOB: May 31, 1976, 35 y.o.   MRN: [id number]  HPI Anxiety: Patient states no improvement in anxiety. 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 For example currently needs license renewal-ran out in June, has received a ticket for driving without a license-but cannot seem to be able to go into the [facility] do to anxiety. Patient states that when she has a social situation, she has sensation of her heart racing, hot sweats, seems like noises are louder, she feels like she will have a heart attack. Although she states she knows she will not, she doesn't like the way this feels. States that the only thing that really has helped her in the past is rapid acting benzos like Xanax. Feels like the extended release Xanax is not helping. Did not like Klonopin, it didn't seem to help her like the regular Xanax, but did feel like it was better than the extended release Xanax. Request switch back to Klonopin. Has not seen therapist.  Dysuria: Occasional right back pain/flank plain. Sometimes lower back pain upon awakening. Continues endorse urinary frequency, sensation of pressure at the urethra opening, occasional lower abdominal pain. Some improvement from last time, but not completely resolved.-does have history of kidney stones. No fever. No chills. Eating well. Drinking well.   Review of Systems As per above    Objective:   Physical Exam  Constitutional: She is oriented to person, place, and time. She appears well-developed and well-nourished.  HENT:  Head: Normocephalic and atraumatic.  Eyes: Conjunctivae and EOM are normal.  Cardiovascular: Normal rate.   Pulmonary/Chest: Effort normal. No respiratory distress.  Abdominal: Soft. She exhibits no distension.  Neurological: She is alert and oriented to person, place, and time.  Skin: Skin is warm. No rash noted.  Psychiatric: She has a normal mood and affect. Her behavior is normal. Judgment and thought content  normal.          Assessment & Plan:

## 2011-09-17 LAB — URINE CULTURE: Colony Count: 2000

## 2011-09-23 ENCOUNTER — Encounter: Payer: Self-pay | Admitting: Family Medicine

## 2011-09-23 DIAGNOSIS — R300 Dysuria: Secondary | ICD-10-CM | POA: Insufficient documentation

## 2011-09-23 NOTE — Assessment & Plan Note (Signed)
Pt states that xanax xr don't seem to help at all, would like to go back on klonopin.  Has not made a therapy appointment yet.

## 2011-09-23 NOTE — Assessment & Plan Note (Signed)
Will check u/a to ensure that no infection present, could possibly be kidney stones since some intermittent flank pain.  Could consider CT scan of abd if no improvement.

## 2011-10-01 ENCOUNTER — Telehealth: Payer: Self-pay | Admitting: Family Medicine

## 2011-10-01 NOTE — Telephone Encounter (Signed)
Spoke to Dr Edmonia James she stated she would like the pt seen to confirm stone and get a CT, pt offered appt. But was coming from Urgent Care and would not get here until 445. Per Bartholome Bill, Pt to go to Urgent Care. Pt agreed.

## 2011-10-01 NOTE — Telephone Encounter (Signed)
Jennifer Mendoza has called back hoping for medication to be called in to relieve the pain from passing the Kidney Stone.

## 2011-10-01 NOTE — Telephone Encounter (Signed)
Ms. Tineo is calling because she has been in a few times about Kidney Stones.  She knows she is passing one right now and would like something called in for her.  She was last seen 10/17.  She uses [facility] on [address].

## 2011-10-02 ENCOUNTER — Inpatient Hospital Stay (INDEPENDENT_AMBULATORY_CARE_PROVIDER_SITE_OTHER)
Admission: RE | Admit: 2011-10-02 | Discharge: 2011-10-02 | Disposition: A | Discharge status: Home / Self Care | Payer: Medicaid Other | Source: Ambulatory Visit | Attending: Family Medicine | Admitting: Family Medicine

## 2011-10-02 ENCOUNTER — Ambulatory Visit (INDEPENDENT_AMBULATORY_CARE_PROVIDER_SITE_OTHER): Payer: Medicaid Other

## 2011-10-02 DIAGNOSIS — S93609A Unspecified sprain of unspecified foot, initial encounter: Secondary | ICD-10-CM

## 2011-10-20 ENCOUNTER — Telehealth: Payer: Self-pay | Admitting: Family Medicine

## 2011-10-20 ENCOUNTER — Encounter: Payer: Self-pay | Admitting: Family Medicine

## 2011-10-20 ENCOUNTER — Telehealth: Payer: Self-pay | Admitting: *Deleted

## 2011-10-20 ENCOUNTER — Ambulatory Visit (INDEPENDENT_AMBULATORY_CARE_PROVIDER_SITE_OTHER): Payer: Medicaid Other | Admitting: Family Medicine

## 2011-10-20 VITALS — BP 113/84 | HR 101 | Ht 65.0 in | Wt 136.0 lb

## 2011-10-20 DIAGNOSIS — S39012A Strain of muscle, fascia and tendon of lower back, initial encounter: Secondary | ICD-10-CM

## 2011-10-20 DIAGNOSIS — S335XXA Sprain of ligaments of lumbar spine, initial encounter: Secondary | ICD-10-CM

## 2011-10-20 MED ORDER — LIDOCAINE 5 % EX PTCH: 1.0000 | MEDICATED_PATCH | Freq: Two times a day (BID) | CUTANEOUS | Status: AC

## 2011-10-20 NOTE — Telephone Encounter (Signed)
Called to follow up with pt about leg pain. Pt states that pain has seemed to have gotten worse since she left clinic. Pt states that it feels somewhat like back labor. No bowel or bladder anesthesia. Pain seems to radiate from L lumbar region to L posterior knee. Pt has taken ibuprofen 800 and tylenol 400 since leaving clinic. Told pt to take additional 800mg  tylenol and to alternate between ibuprofen 800 and tylenol 1000mg  q 3-4 hours (with max of 3g tylenol daily) on a scheduled basis for the next 24 hours. Told pt that if she develops any numbness or intractable pain, to call back. Pt agreeable.

## 2011-10-20 NOTE — Assessment & Plan Note (Addendum)
Trigger point injection x 1 today. Consent was obtatined prior to procedure. Targeted area was marked, cleaned in aseptic fashion. 1 cc of kenalog 40 and 1 cc of 1/% lidocaine was injected in fan distribution. Needle removed with minimal bleeding. Circular curad place. Procedure was tolerated well.  Pt instructed to use schduled ibuprofen as well as tylenol. Lidoderm patch to affected area.

## 2011-10-20 NOTE — Telephone Encounter (Signed)
Got a shot this morning and now her back is worse and leg is feeling really weird - not sure what she should do.

## 2011-10-20 NOTE — Patient Instructions (Addendum)
Lumbosacral Strain Lumbosacral strain is one of the most common causes of back pain. There are many causes of back pain. Most are not serious conditions. CAUSES  Your backbone (spinal column) is made up of 24 main vertebral bodies, the sacrum, and the coccyx. These are held together by muscles and tough, fibrous tissue (ligaments). Nerve roots pass through the openings between the vertebrae. A sudden move or injury to the back may cause injury to, or pressure on, these nerves. This may result in localized back pain or pain movement (radiation) into the buttocks, down the leg, and into the foot. Sharp, shooting pain from the buttock down the back of the leg (sciatica) is frequently associated with a ruptured (herniated) disk. Pain may be caused by muscle spasm alone. Your caregiver can often find the cause of your pain by the details of your symptoms and an exam. In some cases, you may need tests (such as X-rays). Your caregiver will work with you to decide if any tests are needed based on your specific exam. HOME CARE INSTRUCTIONS   Avoid an underactive lifestyle. Active exercise, as directed by your caregiver, is your greatest weapon against back pain.   Avoid hard physical activities (tennis, racquetball, waterskiing) if you are not in proper physical condition for it. This may aggravate or create problems.   If you have a back problem, avoid sports requiring sudden body movements. Swimming and walking are generally safer activities.   Maintain good posture.   Avoid becoming overweight (obese).   Use bed rest for only the most extreme, sudden (acute) episode. Your caregiver will help you determine how much bed rest is necessary.   For acute conditions, you may put ice on the injured area.   Put ice in a plastic bag.   Place a towel between your skin and the bag.   Leave the ice on for 15 to 20 minutes at a time, every 2 hours, or as needed.   After you are improved and more active, it  may help to apply heat for 30 minutes before activities.  See your caregiver if you are having pain that lasts longer than expected. Your caregiver can advise appropriate exercises or therapy if needed. With conditioning, most back problems can be avoided. SEEK IMMEDIATE MEDICAL CARE IF:   You have numbness, tingling, weakness, or problems with the use of your arms or legs.   You experience severe back pain not relieved with medicines.   There is a change in bowel or bladder control.   You have increasing pain in any area of the body, including your belly (abdomen).   You notice shortness of breath, dizziness, or feel faint.   You feel sick to your stomach (nauseous), are throwing up (vomiting), or become sweaty.   You notice discoloration of your toes or legs, or your feet get very cold.   Your back pain is getting worse.   You have a fever.  MAKE SURE YOU:   Understand these instructions.   Will watch your condition.   Will get help right away if you are not doing well or get worse.  Document Released: 08/25/2005 Document Revised: 07/28/2011 Document Reviewed: 02/14/2009 [facility] Patient Information 2012 [place], [place].

## 2011-10-20 NOTE — Telephone Encounter (Signed)
Returned call to patient.  Was seen in office this morning by Dr. Alvester Morin and received trigger point injection.  States pain is worse (10 out of 10) and is radiating down left leg from back.  Pain is constant and lying down or walking doesn't relieve it.  "Feels like nerve pinching."  Takes ibuprofen 800 mg TID and some Tylenol but it is not helping with pain.  Will check with MD for recommendations and call patient back.

## 2011-10-20 NOTE — Progress Notes (Signed)
  Subjective:    Patient ID: Jennifer Mendoza, female    DOB: [birth year], 35 y.o.   MRN: [id number]  HPI LBP x 1 week. Pt states that she was moving laundry and felt pop/strain in her back last week. Pt has had LBP since this point with radiation of pain to legs bilaterally. No bowel or bladder anesthesia. Pt has been taking ibuprofen 800 q6 hours with minimal relief in symptoms. Pain worse when patient lays supine. No LE numbness or weakness.  Review of Systems See HPI     Objective:   Physical Exam Gen: in mild distress 2/2 to pain  MSK: + TTP in R lower lumbosacral region; + Pain with internal/external rotation of L hip with radiation to lumbar region. Otherwise normal exam.    Assessment & Plan:

## 2011-10-20 NOTE — Telephone Encounter (Signed)
PA required for lidoderm patch. Form given to MD.

## 2011-10-22 ENCOUNTER — Telehealth: Payer: Self-pay | Admitting: Family Medicine

## 2011-10-22 NOTE — Telephone Encounter (Signed)
Pt called in. She has steroid injection for possible lumbar sprain earlier in the week. She is experiencing worsening back pain that is not responsive to alternating tylenol and ibuprofen. She is "leaking urine" which is new for her. She denies fecal incontinence. She is able to ambulate with pain. Given severity of pain and urinary incontinence, I advised pt to present to ED for evaluation. Pt agreed with plan, voiced understanding and will come in as soon as possible.

## 2011-10-25 ENCOUNTER — Telehealth: Payer: Self-pay | Admitting: *Deleted

## 2011-10-25 NOTE — Telephone Encounter (Signed)
received notice from medicaid asking if there is a clinical reason patient cannot try preferred Voltaren gel . Form placed in MD box for review.

## 2011-10-26 ENCOUNTER — Telehealth: Payer: Self-pay | Admitting: Family Medicine

## 2011-10-26 NOTE — Telephone Encounter (Signed)
Will forward to dr Newton 

## 2011-10-26 NOTE — Telephone Encounter (Signed)
Pt asking to speak with Dr. Alvester Morin, was seen last week for back pain, has been taking ibuprofen like she was told & its not controlling the pain, says she is still leaking urine, thinks that's b/c of the pressure from the pain. Wants to know if MD will prescribe something stronger for pain, was also asking if MD would order an xray or something to rule out anything that may be causing the pain.

## 2011-10-27 NOTE — Telephone Encounter (Signed)
Called to follow up with pt about back pain. Pt states that she has been using scheduled tylenol and ibuprofen and pain is still significant. Pt was instructed to go to ED for this over the weekend. Pt states that she didn't go because of childcare issues. Discussed with pt that if back pain was significant, to come back in for appointment to address the issue. Also discussed with pt that lidoderm patch PA form was completed. Pt states that she may wait for lidoderm patches. Discussed with pt that if pain is severe, clinic appt is still an option.

## 2011-10-29 NOTE — Telephone Encounter (Signed)
Approval has been received for one year for lidoderm patch. Pharmacy notified.

## 2012-01-06 ENCOUNTER — Other Ambulatory Visit: Payer: Self-pay | Admitting: Family Medicine

## 2013-04-15 ENCOUNTER — Encounter (HOSPITAL_COMMUNITY): Payer: Self-pay | Admitting: *Deleted

## 2013-04-15 ENCOUNTER — Emergency Department (HOSPITAL_COMMUNITY)
Admission: EM | Admit: 2013-04-15 | Discharge: 2013-04-15 | Disposition: A | Discharge status: Home / Self Care | Payer: Medicaid Other | Source: Home / Self Care | Attending: Emergency Medicine | Admitting: Emergency Medicine

## 2013-04-15 DIAGNOSIS — M542 Cervicalgia: Secondary | ICD-10-CM

## 2013-04-15 DIAGNOSIS — K0401 Reversible pulpitis: Secondary | ICD-10-CM

## 2013-04-15 MED ORDER — OXYCODONE-ACETAMINOPHEN 5-325 MG PO TABS: ORAL_TABLET | ORAL | Status: DC

## 2013-04-15 MED ORDER — HYDROCODONE-ACETAMINOPHEN 5-325 MG PO TABS
2.0000 | ORAL_TABLET | Freq: Once | ORAL | Status: AC
  Administered 2013-04-15: 2 via ORAL

## 2013-04-15 MED ORDER — METHOCARBAMOL 500 MG PO TABS: 500.0000 mg | ORAL_TABLET | Freq: Three times a day (TID) | ORAL | Status: DC

## 2013-04-15 MED ORDER — PENICILLIN V POTASSIUM 500 MG PO TABS: 500.0000 mg | ORAL_TABLET | Freq: Three times a day (TID) | ORAL | Status: DC

## 2013-04-15 MED ORDER — MELOXICAM 15 MG PO TABS: 15.0000 mg | ORAL_TABLET | Freq: Every day | ORAL | Status: DC

## 2013-04-15 MED ORDER — HYDROCODONE-ACETAMINOPHEN 5-325 MG PO TABS
ORAL_TABLET | ORAL | Status: AC
  Filled 2013-04-15: qty 2

## 2013-04-15 NOTE — ED Provider Notes (Signed)
Chief Complaint:   Chief Complaint  Patient presents with  . Torticollis    History of Present Illness:   Jennifer Mendoza is a 37 year old female who has had a three-day history of pain on the right side of the neck. This radiates towards the shoulder but not down and the arm. She denies any injury. The pain is woke her up in the middle night one night. It hurts to move her neck to either side. Her husband thought her neck local bit swollen. She's had some facial pain but does have multiple infected teeth. She's had a little bit of drooping of her right eyelid. She's also had some headache. She denies any fever or chills. No diplopia or blurred vision. She denies any nasal congestion or drainage. There is no skin rash. She denies any shortness of breath or chest pain. She denies any pain in the right arm, numbness, tingling, or muscle weakness. She rates the pain as a 10 over 10 in intensity.  Review of Systems:  Other than noted above, the patient denies any of the following symptoms: Constitutional:  No fever, chills, or sweats. ENT:  No nasal congestion, sore throat, or oral ulcerations or lesions. Neck:  No swelling, or adenopathy.  Full ROM without pain. Cardiac:  No chest pain, tightness, or pressure. Respiratory:  No cough, wheezing, or dyspnea. M-S:  No joint pain, muscle pain, or back problems. Neuro:  No muscle weakness, numbness or paresthesias.  PMFSH:  Past medical history, family history, social history, meds, and allergies were reviewed.  No medication allergies. She takes alprazolam, hydrocodone, and Paxil. She has anxiety and chronic lower back pain.  Physical Exam:   Vital signs:  BP 120/83  Pulse 87  Temp(Src) 98.1 F (36.7 C) (Oral)  Resp 22  SpO2 95%  LMP 04/01/2013 General:  Alert, oriented and in no distress. Eye:  PERRL, full EOMs. ENT:  Pharynx clear, no oral lesions. She has multiple carious teeth and widespread dental decay. Neck:  The entire right side of the  neck was tender to palpation. There is no swelling and no adenopathy or mass. The neck has a limited range of motion with 20 extension, 30 of flexion, 10 of lateral bending to each side, and 30 of rotation with pain. Lungs:  No respiratory distress.  Breath sounds clear and equal bilaterally.  No wheezes, rales or rhonchi. Heart:  Regular rhythm.  No gallops, murmers, or rubs. Ext:  No upper extremity edema, pulses full.  Full ROM of joints with no joint or muscle pain to palpation. Neuro:  Alert and oriented times 3.  No focal muscle weakness.  DTRs symmetric.  Sensation intact to light touch. Skin: Clear, warm and dry.  No rash.  Good capillary refill.  Course in Urgent Care Center:   Given Norco 5/325, 2 by mouth for pain.  Assessment:  The primary encounter diagnosis was Neck pain. A diagnosis of Pulpitis was also pertinent to this visit.  Differential diagnosis includes muscle strain, arthritis, or degenerative disc disease. I recommended conservative treatment including exercises, and if no better in one month, following up with a neurosurgeon.  Plan:   1.  The following meds were prescribed:   New Prescriptions   MELOXICAM (MOBIC) 15 MG TABLET    Take 1 tablet (15 mg total) by mouth daily.   METHOCARBAMOL (ROBAXIN) 500 MG TABLET    Take 1 tablet (500 mg total) by mouth 3 (three) times daily.   OXYCODONE-ACETAMINOPHEN (PERCOCET) 5-325  MG PER TABLET    1 to 2 tablets every 6 hours as needed for pain.   PENICILLIN V POTASSIUM (VEETID) 500 MG TABLET    Take 1 tablet (500 mg total) by mouth 3 (three) times daily.   2.  The patient was instructed in symptomatic care and handouts were given. She was instructed in exercises. 3.  The patient was told to return if becoming worse in any way, if no better in 3 or 4 days, and given some red flag symptoms such as fever, worsening pain, or changing neurological symptoms that would indicate earlier return. 4.  Follow up with Dr. Maeola Harman if no  better in one month.    Reuben Likes, MD 04/15/13 [phone]

## 2013-04-15 NOTE — ED Notes (Signed)
Pt  Reports  Symptoms  Of  Neck  Pain     With       Stiffness         Pain  When  Turns  The  Neck       denys  Any  specefic  Injury            Pt  Reports        Symptoms  Not  releived  By  meds

## 2014-01-26 ENCOUNTER — Emergency Department (HOSPITAL_COMMUNITY): Payer: Medicaid Other

## 2014-01-26 ENCOUNTER — Encounter (HOSPITAL_COMMUNITY): Payer: Self-pay | Admitting: Emergency Medicine

## 2014-01-26 ENCOUNTER — Inpatient Hospital Stay (HOSPITAL_COMMUNITY)
Admission: EM | Admit: 2014-01-26 | Discharge: 2014-01-27 | DRG: 176 | Disposition: A | Discharge status: Home / Self Care | Payer: Medicaid Other | Attending: Family Medicine | Admitting: Family Medicine

## 2014-01-26 ENCOUNTER — Emergency Department (INDEPENDENT_AMBULATORY_CARE_PROVIDER_SITE_OTHER)
Admission: EM | Admit: 2014-01-26 | Discharge: 2014-01-26 | Disposition: A | Discharge status: Nursing Home (Includes ICF) | Payer: Medicaid Other | Source: Home / Self Care

## 2014-01-26 DIAGNOSIS — K589 Irritable bowel syndrome without diarrhea: Secondary | ICD-10-CM | POA: Diagnosis present

## 2014-01-26 DIAGNOSIS — F411 Generalized anxiety disorder: Secondary | ICD-10-CM

## 2014-01-26 DIAGNOSIS — F3289 Other specified depressive episodes: Secondary | ICD-10-CM | POA: Diagnosis present

## 2014-01-26 DIAGNOSIS — S39012A Strain of muscle, fascia and tendon of lower back, initial encounter: Secondary | ICD-10-CM

## 2014-01-26 DIAGNOSIS — I2699 Other pulmonary embolism without acute cor pulmonale: Principal | ICD-10-CM

## 2014-01-26 DIAGNOSIS — J9819 Other pulmonary collapse: Secondary | ICD-10-CM | POA: Diagnosis present

## 2014-01-26 DIAGNOSIS — M545 Low back pain, unspecified: Secondary | ICD-10-CM | POA: Diagnosis present

## 2014-01-26 DIAGNOSIS — F17200 Nicotine dependence, unspecified, uncomplicated: Secondary | ICD-10-CM | POA: Diagnosis present

## 2014-01-26 DIAGNOSIS — F329 Major depressive disorder, single episode, unspecified: Secondary | ICD-10-CM | POA: Diagnosis present

## 2014-01-26 DIAGNOSIS — Z7982 Long term (current) use of aspirin: Secondary | ICD-10-CM

## 2014-01-26 DIAGNOSIS — M549 Dorsalgia, unspecified: Secondary | ICD-10-CM

## 2014-01-26 DIAGNOSIS — R079 Chest pain, unspecified: Secondary | ICD-10-CM

## 2014-01-26 DIAGNOSIS — K219 Gastro-esophageal reflux disease without esophagitis: Secondary | ICD-10-CM | POA: Diagnosis present

## 2014-01-26 DIAGNOSIS — G8929 Other chronic pain: Secondary | ICD-10-CM | POA: Diagnosis present

## 2014-01-26 LAB — URINALYSIS, ROUTINE W REFLEX MICROSCOPIC
BILIRUBIN URINE: NEGATIVE
Glucose, UA: NEGATIVE mg/dL
Ketones, ur: NEGATIVE mg/dL
LEUKOCYTES UA: NEGATIVE
NITRITE: NEGATIVE
PH: 6.5 (ref 5.0–8.0)
Protein, ur: NEGATIVE mg/dL
Urobilinogen, UA: 0.2 mg/dL (ref 0.0–1.0)

## 2014-01-26 LAB — COMPREHENSIVE METABOLIC PANEL
ALBUMIN: 3.7 g/dL (ref 3.5–5.2)
ALK PHOS: 99 U/L (ref 39–117)
ALT: 36 U/L — AB (ref 0–35)
AST: 29 U/L (ref 0–37)
BUN: 8 mg/dL (ref 6–23)
CO2: 28 mEq/L (ref 19–32)
Calcium: 9.5 mg/dL (ref 8.4–10.5)
Chloride: 95 mEq/L — ABNORMAL LOW (ref 96–112)
Creatinine, Ser: 0.77 mg/dL (ref 0.50–1.10)
GFR calc Af Amer: >90 mL/min (ref >90)
GFR calc non Af Amer: >90 mL/min (ref >90)
Glucose, Bld: 99 mg/dL (ref 70–99)
POTASSIUM: 3.7 meq/L (ref 3.7–5.3)
SODIUM: 138 meq/L (ref 137–147)
TOTAL PROTEIN: 7.8 g/dL (ref 6.0–8.3)
Total Bilirubin: 0.8 mg/dL (ref 0.3–1.2)

## 2014-01-26 LAB — CBC WITH DIFFERENTIAL/PLATELET
BASOS PCT: 0 % (ref 0–1)
Basophils Absolute: 0 10*3/uL (ref 0.0–0.1)
EOS ABS: 0.1 10*3/uL (ref 0.0–0.7)
Eosinophils Relative: 1 % (ref 0–5)
HCT: 41.4 % (ref 36.0–46.0)
Hemoglobin: 14.3 g/dL (ref 12.0–15.0)
Lymphocytes Relative: 25 % (ref 12–46)
Lymphs Abs: 2.2 10*3/uL (ref 0.7–4.0)
MCH: 32.4 pg (ref 26.0–34.0)
MCHC: 34.5 g/dL (ref 30.0–36.0)
MCV: 93.7 fL (ref 78.0–100.0)
Monocytes Absolute: 0.8 10*3/uL (ref 0.1–1.0)
Monocytes Relative: 9 % (ref 3–12)
NEUTROS PCT: 65 % (ref 43–77)
Neutro Abs: 5.8 10*3/uL (ref 1.7–7.7)
PLATELETS: 211 10*3/uL (ref 150–400)
RBC: 4.42 MIL/uL (ref 3.87–5.11)
RDW: 13 % (ref 11.5–15.5)
WBC: 8.9 10*3/uL (ref 4.0–10.5)

## 2014-01-26 LAB — URINE MICROSCOPIC-ADD ON

## 2014-01-26 LAB — RAPID URINE DRUG SCREEN, HOSP PERFORMED
AMPHETAMINES: NOT DETECTED
Barbiturates: NOT DETECTED
Benzodiazepines: POSITIVE — AB
Cocaine: NOT DETECTED
Opiates: NOT DETECTED
Tetrahydrocannabinol: NOT DETECTED

## 2014-01-26 LAB — I-STAT TROPONIN, ED: TROPONIN I, POC: 0 ng/mL (ref 0.00–0.08)

## 2014-01-26 LAB — TROPONIN I: Troponin I: <0.3 ng/mL (ref <0.30)

## 2014-01-26 LAB — PREGNANCY, URINE: Preg Test, Ur: NEGATIVE

## 2014-01-26 LAB — LIPASE, BLOOD: LIPASE: 10 U/L — AB (ref 11–59)

## 2014-01-26 LAB — D-DIMER, QUANTITATIVE (NOT AT ARMC): D DIMER QUANT: 1.43 ug{FEU}/mL — AB (ref 0.00–0.48)

## 2014-01-26 MED ORDER — HYDROCODONE-ACETAMINOPHEN 5-325 MG PO TABS
1.0000 | ORAL_TABLET | ORAL | Status: DC | PRN
  Administered 2014-01-26 – 2014-01-27 (×5): 2 via ORAL
  Filled 2014-01-26 (×5): qty 2

## 2014-01-26 MED ORDER — ENOXAPARIN SODIUM 80 MG/0.8ML ~~LOC~~ SOLN
70.0000 mg | Freq: Two times a day (BID) | SUBCUTANEOUS | Status: DC
  Administered 2014-01-26 – 2014-01-27 (×2): 70 mg via SUBCUTANEOUS
  Filled 2014-01-26 (×4): qty 0.8

## 2014-01-26 MED ORDER — SODIUM CHLORIDE 0.9 % IV SOLN
1000.0000 mL | INTRAVENOUS | Status: DC
  Administered 2014-01-26 (×2): 1000 mL via INTRAVENOUS

## 2014-01-26 MED ORDER — PANTOPRAZOLE SODIUM 40 MG PO TBEC
40.0000 mg | DELAYED_RELEASE_TABLET | Freq: Every day | ORAL | Status: DC
  Administered 2014-01-27 (×2): 40 mg via ORAL
  Filled 2014-01-26 (×2): qty 1

## 2014-01-26 MED ORDER — SODIUM CHLORIDE 0.9 % IV SOLN
1000.0000 mL | Freq: Once | INTRAVENOUS | Status: AC
  Administered 2014-01-26: 1000 mL via INTRAVENOUS

## 2014-01-26 MED ORDER — ASPIRIN 81 MG PO CHEW: 324.0000 mg | CHEWABLE_TABLET | Freq: Once | ORAL | Status: DC

## 2014-01-26 MED ORDER — PAROXETINE HCL ER 37.5 MG PO TB24
37.5000 mg | ORAL_TABLET | Freq: Every day | ORAL | Status: DC
  Administered 2014-01-27: 37.5 mg via ORAL
  Filled 2014-01-26: qty 1

## 2014-01-26 MED ORDER — NICOTINE 21 MG/24HR TD PT24
21.0000 mg | MEDICATED_PATCH | Freq: Once | TRANSDERMAL | Status: DC
  Administered 2014-01-26: 21 mg via TRANSDERMAL
  Filled 2014-01-26: qty 1

## 2014-01-26 MED ORDER — CLONAZEPAM 1 MG PO TABS
1.0000 mg | ORAL_TABLET | Freq: Two times a day (BID) | ORAL | Status: DC | PRN
  Administered 2014-01-26: 1 mg via ORAL
  Filled 2014-01-26: qty 1

## 2014-01-26 MED ORDER — ONDANSETRON HCL 4 MG/2ML IJ SOLN
4.0000 mg | Freq: Once | INTRAMUSCULAR | Status: AC
  Administered 2014-01-26: 4 mg via INTRAVENOUS
  Filled 2014-01-26: qty 2

## 2014-01-26 MED ORDER — ASPIRIN 81 MG PO CHEW
CHEWABLE_TABLET | ORAL | Status: AC
  Filled 2014-01-26: qty 3

## 2014-01-26 MED ORDER — IOHEXOL 350 MG/ML SOLN
80.0000 mL | Freq: Once | INTRAVENOUS | Status: AC | PRN
  Administered 2014-01-26: 80 mL via INTRAVENOUS

## 2014-01-26 MED ORDER — HYDROMORPHONE HCL PF 1 MG/ML IJ SOLN
1.0000 mg | INTRAMUSCULAR | Status: AC | PRN
  Administered 2014-01-26 (×3): 1 mg via INTRAVENOUS
  Filled 2014-01-26 (×3): qty 1

## 2014-01-26 MED ORDER — SODIUM CHLORIDE 0.9 % IJ SOLN
3.0000 mL | Freq: Two times a day (BID) | INTRAMUSCULAR | Status: DC
  Administered 2014-01-27 (×2): 3 mL via INTRAVENOUS

## 2014-01-26 NOTE — ED Notes (Signed)
Dr. Tomi Bamberger at the bedside.

## 2014-01-26 NOTE — ED Provider Notes (Signed)
CSN: [id number]     Arrival date & time 01/26/14  1512 History   First MD Initiated Contact with Patient 01/26/14 1529     Chief Complaint  Patient presents with  . Chest Pain    Patient is a 38 y.o. female presenting with chest pain. The history is provided by the patient.  Chest Pain Pain location:  L chest Pain quality: sharp   Pain radiates to:  Upper back and L shoulder Pain radiates to the back: yes   Pain severity:  Severe Onset quality:  Sudden Duration:  2 days Worsened by:  Deep breathing and movement Associated symptoms: shortness of breath   Associated symptoms: no abdominal pain, no cough, no fatigue, no fever and no nausea   Risk factors: smoking   Risk factors: no hypertension and no prior DVT/PE     Past Medical History  Diagnosis Date  . Depression   . Anxiety   . Tobacco user   . Migraine headache without aura   . Agoraphobia with panic attacks   . GERD (gastroesophageal reflux disease)   . History of IBS   . PANIC ATTACKS 01/26/2007   Past Surgical History  Procedure Laterality Date  . Carotid stent  10/1998    For kidney stones   Family History  Problem Relation Age of Onset  . Osteoporosis Mother    History  Substance Use Topics  . Smoking status: Current Every Day Smoker -- 0.30 packs/day    Types: Cigarettes  . Smokeless tobacco: Never Used     Comment: decreased smoking  . Alcohol Use: No   OB History   Grav Para Term Preterm Abortions TAB SAB Ect Mult Living                 Review of Systems  Constitutional: Negative for fever and fatigue.  Respiratory: Positive for shortness of breath. Negative for cough.   Cardiovascular: Positive for chest pain.  Gastrointestinal: Negative for nausea and abdominal pain.  Hematological: Negative for adenopathy.  All other systems reviewed and are negative.      Allergies  Review of patient's allergies indicates no known allergies.  Home Medications   Current Outpatient Rx  Name  Route   Sig  Dispense  Refill  . aspirin 325 MG tablet   Oral   Take 650 mg by mouth once.         . clonazePAM (KLONOPIN) 1 MG tablet   Oral   Take 1 mg by mouth 2 (two) times daily as needed for anxiety.         Marland Kitchen oxyCODONE-acetaminophen (PERCOCET/ROXICET) 5-325 MG per tablet   Oral   Take 1 tablet by mouth every 4 (four) hours as needed for moderate pain. 1 to 2 tablets every 6 hours as needed for pain.         Marland Kitchen PARoxetine (PAXIL-CR) 37.5 MG 24 hr tablet   Oral   Take 37.5 mg by mouth every morning.            BP 96/57  Pulse 105  Temp(Src) 98.2 F (36.8 C) (Oral)  Resp 18  SpO2 96%  LMP 01/08/2014 Physical Exam  Nursing note and vitals reviewed. Constitutional: She appears well-developed and well-nourished. She appears distressed.  HENT:  Head: Normocephalic and atraumatic.  Right Ear: External ear normal.  Left Ear: External ear normal.  Eyes: Conjunctivae are normal. Right eye exhibits no discharge. Left eye exhibits no discharge. No scleral icterus.  Neck: Neck  supple. No tracheal deviation present.  Cardiovascular: Normal rate, regular rhythm and intact distal pulses.   Pulmonary/Chest: Effort normal and breath sounds normal. No stridor. No respiratory distress. She has no wheezes. She has no rales.  Abdominal: Soft. Bowel sounds are normal. She exhibits no distension. There is tenderness in the epigastric area and left upper quadrant. There is guarding. There is no rebound and no CVA tenderness. No hernia.  Musculoskeletal: She exhibits no edema and no tenderness.  Neurological: She is alert. She has normal strength. No cranial nerve deficit (no facial droop, extraocular movements intact, no slurred speech) or sensory deficit. She exhibits normal muscle tone. She displays no seizure activity. Coordination normal.  Skin: Skin is warm and dry. No rash noted. She is not diaphoretic.  Psychiatric: She has a normal mood and affect.    ED Course  Procedures (including  critical care time) Labs Review Labs Reviewed  COMPREHENSIVE METABOLIC PANEL - Abnormal; Notable for the following:    Chloride 95 (*)    ALT 36 (*)    All other components within normal limits  LIPASE, BLOOD - Abnormal; Notable for the following:    Lipase 10 (*)    All other components within normal limits  D-DIMER, QUANTITATIVE - Abnormal; Notable for the following:    D-Dimer, Quant 1.43 (*)    All other components within normal limits  CBC WITH DIFFERENTIAL  URINALYSIS, ROUTINE W REFLEX MICROSCOPIC  CBC  I-STAT TROPOININ, ED   Imaging Review Dg Chest 2 View  01/26/2014   CLINICAL DATA:  Shortness of breath, right side chest pain  EXAM: CHEST  2 VIEW  COMPARISON:  None.  FINDINGS: Cardiomediastinal silhouette is unremarkable. Central mild bronchitic changes. There is streaky bilateral basilar airspace disease suspicious for bilateral infiltrates. Follow-up examination to assure resolution is recommended.  IMPRESSION: Central mild bronchitic changes. There is streaky bilateral basilar airspace disease suspicious for bilateral infiltrates. Follow-up examination to assure resolution is recommended.   Electronically Signed   By: Lahoma Crocker M.D.   On: 01/26/2014 16:37   Ct Angio Chest W/cm &/or Wo Cm  01/26/2014   CLINICAL DATA:  Left chest pain radiating to left shoulder, increasing with breathing/ improvement. History of anxiety.  EXAM: CT ANGIOGRAPHY CHEST WITH CONTRAST  TECHNIQUE: Multidetector CT imaging of the chest was performed using the standard protocol during bolus administration of intravenous contrast. Multiplanar CT image reconstructions and MIPs were obtained to evaluate the vascular anatomy.  CONTRAST:  33m OMNIPAQUE IOHEXOL 350 MG/ML SOLN  COMPARISON:  Chest radiographs dated 01/26/2014  FINDINGS: Segmental/subsegmental bilateral lower lobe pulmonary emboli. Overall clot burden is small to moderate. No findings to suggest right heart strain. RV-to-LV ratio 0.91.  Mild patchy  bilateral lower lobe opacities, at least some of which is favored to reflect atelectasis, although developing pulmonary infarcts are difficult to exclude. Lower lobe pneumonia is possible but considered less likely. Additional compressive atelectasis in the medial right middle lobe and lingula.  Trace right and small left pleural effusions.  No pneumothorax.  Heart is normal in size.  No pericardial effusion.  No suspicious mediastinal, hilar, or axillary lymphadenopathy.  Visualized upper abdomen is notable for mild hepatic steatosis.  Visualized osseous structures are within normal limits.  Review of the MIP images confirms the above findings.  IMPRESSION: Segmental/subsegmental bilateral lower lobe pulmonary emboli. Overall clot burden is small to moderate.  Mild patchy bilateral lower lobe opacities, at least some of which is favored to reflect atelectasis, although  developing pulmonary infarcts are difficult to exclude. Lower lobe pneumonia is possible but considered less likely.  Trace right and small left pleural effusions.  Critical value/emergent results were called by telephone at the time of interpretation on 01/26/2014 at 6:34 PM to Dr. Dorie Rank , who verbally acknowledged these results.   Electronically Signed   By: Julian Hy M.D.   On: 01/26/2014 18:39     EKG Interpretation   Date/Time:  Saturday January 26 2014 15:32:50 EST Ventricular Rate:  109 PR Interval:  133 QRS Duration: 79 QT Interval:  324 QTC Calculation: 436 R Axis:   44 Text Interpretation:  Sinus tachycardia No significant change since last  tracing Confirmed by Khara Renaud  MD-J, Jaliyah Fotheringham ([id number]) on 01/26/2014 6:37:39 PM     Medications  0.9 %  sodium chloride infusion (0 mLs Intravenous Stopped 01/26/14 1852)    Followed by  0.9 %  sodium chloride infusion (1,000 mLs Intravenous New Bag/Given 01/26/14 1849)  enoxaparin (LOVENOX) injection 70 mg (not administered)  ondansetron (ZOFRAN) injection 4 mg (4 mg Intravenous  Given 01/26/14 1606)  HYDROmorphone (DILAUDID) injection 1 mg (1 mg Intravenous Given 01/26/14 1847)  iohexol (OMNIPAQUE) 350 MG/ML injection 80 mL (80 mLs Intravenous Contrast Given 01/26/14 1805)    MDM   Final diagnoses:  Pulmonary embolism    Pt remained stable in the ED although BP did decrease after pain meds.  Remained tachycardic.  No clear history to suggest the cause of her PE.  No history of CT, no recent trips or travel.  Pt states that her brother was recently diagnosed with a PE.   She may need a work up for a heriditary condition such as protein c or s.  Will admit for further treatment.   Kathalene Frames, MD 01/26/14 [phone]

## 2014-01-26 NOTE — ED Notes (Signed)
Pt started to go outside to " move her car and get some personal things out of it". I told her that she was unable to go outside at this time and pt asked " why not, why cant i go to my car this is bullshit" RN was notified and told pt that he would go with her outside then pt changed her mind. While the pt was in her room yelling about smoking and needing a nicotine patch I asked her if that was what she would like. Pt stated that "she needed something". RN notified.

## 2014-01-26 NOTE — Discharge Instructions (Signed)
We are sending you to [facility] ED for further evaluation of your chest pain.    Chest Pain (Nonspecific) Chest pain has many causes. Your pain could be caused by something serious, such as a heart attack or a blood clot in the lungs. It could also be caused by something less serious, such as a chest bruise or a virus. Follow up with your doctor. More lab tests or other studies may be needed to find the cause of your pain. Most of the time, nonspecific chest pain will improve within 2 to 3 days of rest and mild pain medicine. HOME CARE  For chest bruises, you may put ice on the sore area for 15-20 minutes, 03-04 times a day. Do this only if it makes you feel better.  Put ice in a plastic bag.  Place a towel between the skin and the bag.  Rest for the next 2 to 3 days.  Go back to work if the pain improves.  See your doctor if the pain lasts longer than 1 to 2 weeks.  Only take medicine as told by your doctor.  Quit smoking if you smoke. GET HELP RIGHT AWAY IF:   There is more pain or pain that spreads to the arm, neck, jaw, back, or belly (abdomen).  You have shortness of breath.  You cough more than usual or cough up blood.  You have very bad back or belly pain, feel sick to your stomach (nauseous), or throw up (vomit).  You have very bad weakness.  You pass out (faint).  You have a fever. Any of these problems may be serious and may be an emergency. Do not wait to see if the problems will go away. Get medical help right away. Call your local emergency services 911 in [place] Do not drive yourself to the hospital. MAKE SURE YOU:   Understand these instructions.  Will watch this condition.  Will get help right away if you or your child is not doing well or gets worse. Document Released: 05/03/2008 Document Revised: 02/07/2012 Document Reviewed: 05/03/2008 [facility] Patient Information 2014 [place], [place].

## 2014-01-26 NOTE — H&P (Signed)
[facility] Admission History and Physical Service Pager: [phone]  Patient name: Jennifer Mendoza Medical record number: [id number] Date of birth: 09/04/1976 Age: 38 y.o. Gender: female  Primary Care Provider: [name], MD Consultants: none Code Status: Full (confirmed on admission)  Chief Complaint: Chest pain  Assessment and Plan: Jennifer Mendoza is a 38 y.o. female presenting with pleuritic chest pain and pressure for past 2 days, found to have bilateral lower lobe PE on CT. PMH is significant for chronic low back pain, depression and anxiety.  # Chest pain, secondary to pulmonary emboli, bilateral lower lobes Presentation with sudden acute onset pleuritic chest pain/tightness x 2 days, initial concern for possible ACS given exertional component. No prior h/o DVT/PE, significant family h/o CAD with MI and PE (ages 31-40s). Initial work-up with elevated D-dimer (1.43), CTA (bilateral lower lobe PE), trop-poct (negative), EKG (sinus tachy). Current episode of PE does not have obvious provoking factor (no OCPs, no immobilization, no malignancy). - Admit to FPTS, inpatient status, telemetry - closely monitor vitals / resp status - ordered Troponin-I (cycle x 3 q 6 hr) - repeat EKG in AM - f/u CBC in AM - start therapeutic anticoagulation - Lovenox (per pharm) for likely 24 hrs, expect to transition to new oral anti-coagulant prior to discharge for continued therapy x 6 - 12 months - Given age and first degree relative (brother) with recent bilateral PE, pt will need an evaluation for thrombophilia: antithrombin, protein c and s deficiencies, factor V leiden, and prothrombin gene mutation; this should be performed 2 weeks after cessation of anti-coagulant if possible    # Chronic low back pain - Continue Norco 5-364m x 1-2 tabs q 4 hr PRN pain  # Depression / Anxiety - Continue home Paxil-24hr 37.561mdaily, Klonopin 61m9mID PRN  # Tobacco abuse - chronic  h/o smoking, current smoker 2 ppd >20 yrs - Smoking cessation - ordered Nicotine patch 261m72mily - add-on UDS, Upreg  FEN/GI: - s/p 1L NS bolus - SLIV - Protonix 40mg34m Prophylaxis: on therapeutic anticoagulation with Lovenox  Disposition: Admit to FPTS inpatient status, telemetry, for bilateral PE, treatment with anticoagulation, if improved, expect to discharge in 1-2 days once clinically improved and stable on PO anticoagulation  History of Present Illness:   Jennifer Mendoza is a 37 y.69 female presenting with chest pain / pressure, also left chest wall and neck pain. Reported that symptoms started suddenly about 2 days ago after waking up from a nap. Describes pain as constant (without any relief since onset), persistent 10/10, "heaviness, tightness" in chest and "sharp, stabbing in left side". Took ASA and Naproxen without any significant relief. Pain is worse with deep breaths, laying flat, and any exertion. Admits SOB (mostly due to shallow breathing). Denies significant associated symptoms. No prior episodes of similar symptoms in past.  Seen at Urgent Care earlier today, sent to ED for further work-up (concern ACS vs PE). In ED, tachycardic (HR >100), initially with low BP 90s / 50s, afebrile, no O2 req (>95% on RA). Work-up with elevated D-dimer (1.43), CTA Chest (showed bilateral pulmonary embolus), Trop-POCT (neg), EKG (sinus tachycardia, no acute changes), otherwise CMET / CBC (nml), UA (neg). Received Dilaudid IV 61mg f72mpain with some relief, ordered Lovenox per pharm.  Denies any history of prior PE / DVT. Denies recent history of immobilization (no long distance travel, plane flights), never used OCPs or hormonal therapy, no known history of malignancy. The patient also  denies any history of hypertension, high cholesterol or elevated blood sugars. She notes that these have all be checked by her PCP, but we do not have records of this. She is no longer a patient of [facility].    Review Of Systems: Per HPI with the following additions:  Admits epigastric abdominal pain. Denies any LE edema or pain, recent illness, cough, nausea, vomiting, weakness, numbness / tingling.  Otherwise 12 point review of systems was performed and was unremarkable.  Patient Active Problem List   Diagnosis Date Noted  . Pulmonary embolism, bilateral 01/26/2014  . Lumbar strain 10/20/2011  . Dysuria 09/23/2011  . Sinus pain 09/06/2011  . Knee pain, right 02/15/2011  . 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 BACK PAIN, CHRONIC, INTERMITTENT 11/11/2008  . BENIGN NEOPLASM OF VAGINA 03/05/2008  . ANXIETY 01/26/2007  . TOBACCO DEPENDENCE 01/26/2007  . MIGRAINE, UNSPEC., W/O INTRACTABLE MIGRAINE 01/26/2007  . NEPHROLITHIASIS 01/26/2007   Past Medical History: Past Medical History  Diagnosis Date  . Depression   . Anxiety   . Tobacco user   . Migraine headache without aura   . Agoraphobia with panic attacks   . GERD (gastroesophageal reflux disease)   . History of IBS   . PANIC ATTACKS 01/26/2007   Past Surgical History: Past Surgical History  Procedure Laterality Date  . Carotid stent  10/1998    For kidney stones   Social History: History  Substance Use Topics  . Smoking status: Current Every Day Smoker -- 0.30 packs/day    Types: Cigarettes  . Smokeless tobacco: Never Used     Comment: decreased smoking  . Alcohol Use: No   Additional social history: - Lives at home with 4 children, smokes 0.5ppd. Denies illicit drug use. Please also refer to relevant sections of EMR.  Family History: Family History  Problem Relation Age of Onset  . Osteoporosis Mother    Allergies and Medications: No Known Allergies No current facility-administered medications on file prior to encounter.   Current Outpatient Prescriptions on File Prior to Encounter  Medication Sig Dispense Refill  . PARoxetine (PAXIL-CR) 37.5 MG 24 hr tablet Take 37.5 mg by mouth every morning.          Objective: BP  106/58  Pulse 111  Temp(Src) 98.6 F (37 C) (Oral)  Resp 20  SpO2 95%  LMP 01/08/2014 Exam: General: sitting up in bed, well-appearing but uncomfortable due to pain, NAD HEENT: PERRL, EOMI, patent nares w/o congestion, oropharynx clear, MMM Cardiovascular: RRR, no murmurs Respiratory: CTAB, no wheezing, crackles, or rhonchi. Normal WOB, but limited participation in resp exam d/t pleuritic pain. No tachypnea; no palpable chest pain  Abdomen: soft, mild epigastric tenderness, non-distended, +active BS Extremities: non-tender, no edema, moves all ext, WWP, +2 peripheral pulses rad / dp Skin: warm, dry, intact Neuro: awake, alert, oriented, grossly non-focal, intact muscle strength 5/5 in all ext  Labs and Imaging: CBC BMET   Recent Labs Lab 01/26/14 1600  WBC 8.9  HGB 14.3  HCT 41.4  PLT 211    Recent Labs Lab 01/26/14 1600  NA 138  K 3.7  CL 95*  CO2 28  BUN 8  CREATININE 0.77  GLUCOSE 99  CALCIUM 9.5     Troponin-POCT - neg UA (neg nitrite, neg leuks, no WBCs, many squam) Upreg (ordered) UDS (ordered)  D-Dimer - 1.43  2/28 EKG Sinus tachycardia HR 109, no acute ST-T wave changes (no prior EKG for comparison)  2/28 CXR 2v IMPRESSION:  Central mild bronchitic  changes. There is streaky bilateral basilar  airspace disease suspicious for bilateral infiltrates. Follow-up  examination to assure resolution is recommended.  2/28 Chest CTA IMPRESSION:  Segmental/subsegmental bilateral lower lobe pulmonary emboli.  Overall clot burden is small to moderate.  Mild patchy bilateral lower lobe opacities, at least some of which  is favored to reflect atelectasis, although developing pulmonary  infarcts are difficult to exclude. Lower lobe pneumonia is possible  but considered less likely.  Trace right and small left pleural effusions.  Nobie Putnam, DO 01/26/2014, 8:32 PM PGY-1, [facility] pager: [phone], text pages  welcome  I have evaluated the patient. I read and agree with the note above. Alterations were made in this font color.   Marena Chancy Maricela Bo, MD, MBA 01/26/2014, 11:15 PM Family Medicine Resident, PGY-3

## 2014-01-26 NOTE — ED Notes (Signed)
Patient transported to X-ray 

## 2014-01-26 NOTE — ED Provider Notes (Signed)
CSN: [id number]     Arrival date & time 01/26/14  1351 History   First MD Initiated Contact with Patient 01/26/14 1406     Chief Complaint  Patient presents with  . Chest Pain   Patient is a 38 y.o. female presenting with chest pain. The history is provided by the patient.  Chest Pain Pain location:  L chest Pain quality: sharp   Pain radiates to:  Upper back and L shoulder Pain radiates to the back: yes   Pain severity:  Severe Onset quality:  Sudden Duration:  2 days Timing:  Constant Progression:  Unchanged Chronicity:  New Context: breathing, movement and at rest   Context: no drug use, not eating, no intercourse, not lifting, not raising an arm, no stress and no trauma   Relieved by:  Nothing Ineffective treatments:  Certain positions Associated symptoms: cough, dizziness, nausea and shortness of breath   Associated symptoms: no abdominal pain, no AICD problem, no altered mental status, no anorexia, no dysphagia, no fatigue, no fever, no numbness, no orthopnea, no palpitations, no PND and not vomiting   Risk factors: no aortic disease   Pt reports sudden onset of (L) sided CP approx 2 days ago when she awoke from a nap. The pain has persisted though she admits the intensity varies somewhat. The pain is worse when she lies down, coughs or takes a deep breath. The pain is described as sharp and radiates to the (L) upper back and (L) shoulder. She has also had persistent SOB and intermittent nausea and dizziness. She has only taken aspirin for the pain w/o relief. Pt is a smoker but denies other known health problems. Denies recent illnesses. No fever though she admits she at times feels "flush". Pt describes a significant family h/o CAD. (2 cousins that died in their 73's-30's w/ CAD and a brother who recently had a CABG and pulmonary embolism at 49. She denies recent travel. Pt is a smoker.   Past Medical History  Diagnosis Date  . Depression   . Anxiety   . Tobacco user   .  Migraine headache without aura   . Agoraphobia with panic attacks   . GERD (gastroesophageal reflux disease)   . History of IBS   . PANIC ATTACKS 01/26/2007   Past Surgical History  Procedure Laterality Date  . Carotid stent  10/1998    For kidney stones   Family History  Problem Relation Age of Onset  . Osteoporosis Mother    History  Substance Use Topics  . Smoking status: Current Every Day Smoker -- 0.30 packs/day    Types: Cigarettes  . Smokeless tobacco: Never Used     Comment: decreased smoking  . Alcohol Use: No   OB History   Grav Para Term Preterm Abortions TAB SAB Ect Mult Living                 Review of Systems  Constitutional: Negative for fever and fatigue.  HENT: Negative.  Negative for trouble swallowing.   Eyes: Negative.   Respiratory: Positive for cough and shortness of breath. Negative for wheezing and stridor.   Cardiovascular: Positive for chest pain. Negative for palpitations, orthopnea and PND.  Gastrointestinal: Positive for nausea. Negative for vomiting, abdominal pain, diarrhea and anorexia.  Endocrine: Negative.   Genitourinary: Negative.   Musculoskeletal: Negative.   Skin: Negative.   Allergic/Immunologic: Negative.   Neurological: Positive for dizziness. Negative for numbness.  Hematological: Negative.   Psychiatric/Behavioral: Negative.  Allergies  Review of patient's allergies indicates no known allergies.  Home Medications   Current Outpatient Rx  Name  Route  Sig  Dispense  Refill  . famotidine (PEPCID) 40 MG tablet   Oral   Take 1 tablet (40 mg total) by mouth daily.   30 tablet   6   . PARoxetine (PAXIL-CR) 37.5 MG 24 hr tablet   Oral   Take 37.5 mg by mouth every morning.           Marland Kitchen EXPIRED: clonazePAM (KLONOPIN) 0.5 MG tablet   Oral   Take 1 tablet (0.5 mg total) by mouth 2 (two) times daily as needed for anxiety.   60 tablet   1   . meloxicam (MOBIC) 15 MG tablet   Oral   Take 1 tablet (15 mg total) by  mouth daily.   15 tablet   0   . oxyCODONE-acetaminophen (PERCOCET) 5-325 MG per tablet      1 to 2 tablets every 6 hours as needed for pain.   20 tablet   0    BP 110/90  Pulse 113  Temp(Src) 97.8 F (36.6 C) (Oral)  Resp 22  SpO2 99%  LMP 11/02/2013 Physical Exam  Constitutional: She is oriented to person, place, and time. She appears well-developed and well-nourished. She appears distressed.  HENT:  Head: Normocephalic and atraumatic.  Eyes: Conjunctivae are normal.  Neck: Neck supple.  Cardiovascular: Regular rhythm.  Tachycardia present.   Pulmonary/Chest: Breath sounds normal. Tachypnea noted.  Very rapid shallow resp  Abdominal: Soft. Bowel sounds are normal. There is no tenderness.  Musculoskeletal: Normal range of motion.  Neurological: She is alert and oriented to person, place, and time.  Skin: Skin is warm and dry.  Psychiatric: She has a normal mood and affect.    ED Course  Procedures (including critical care time)   Date: 01/26/2014  Rate: 111  Rhythm: ST  QRS Axis: Normal  Intervals: normal  ST/T Wave abnormalities: None  Conduction Disutrbances:   Narrative Interpretation: ST, "Septal Infarct" age undetermined /Abnormal EKG  Old EKG Reviewed: No previous EKG   Labs Review Labs Reviewed - No data to display Imaging Review No results found.   MDM   1. Chest pain    2 day h/o of persistent CP, SOB, cough and intermittent nausea and dizziness. Pain less severe when sitting upright, worse when lying flat. Abd exam is unremarkable, no recent illnesses. Abnormal EKG w/o previous to compare. Significant family h/o CAD. Concern for cardiac source of pain vs PE vs pneumothorax . Will send to [facility] for further evaluation of of CP and associated sx's. Discussed pt with Dr Jake Michaelis who has also reviewed EKG and is agreement w/ plan.     Jeryl Columbia, NP 01/26/14 1505

## 2014-01-26 NOTE — ED Notes (Signed)
Onset 1 1/2 days ago sudden sharp left anterior pain, under the left breast and left shoulder with SOB.  Since then she has rested at home, the pain is constant.  She denies recent URI, coughing, trauma or prolonged trips.

## 2014-01-26 NOTE — ED Notes (Signed)
Patient returned from X-ray 

## 2014-01-26 NOTE — ED Notes (Signed)
Patient and family given something to drink per MD permission. Patient made aware of need to wait for a bed, admitting physician, and orders. Patient verbalized understanding. Patient made comfortable.

## 2014-01-26 NOTE — Progress Notes (Signed)
ANTICOAGULATION CONSULT NOTE - Initial Consult  Pharmacy Consult for lovenox Indication: pulmonary embolus  No Known Allergies  Patient Measurements:    Vital Signs: Temp: 98.2 F (36.8 C) (02/28 1532) Temp src: Oral (02/28 1752) BP: 96/57 mmHg (02/28 1752) Pulse Rate: 105 (02/28 1752)  Labs:  Recent Labs  01/26/14 1600  HGB 14.3  HCT 41.4  PLT 211  CREATININE 0.77    The CrCl is unknown because both a height and weight (above a minimum accepted value) are required for this calculation.   Medical History: Past Medical History  Diagnosis Date  . Depression   . Anxiety   . Tobacco user   . Migraine headache without aura   . Agoraphobia with panic attacks   . GERD (gastroesophageal reflux disease)   . History of IBS   . PANIC ATTACKS 01/26/2007   Assessment: 36 yof presented to the ED with CP. Her D-dimer is elevated and CT is positive for bilateral lower lobe PE. CBC is WNL. She is not on any anticoagulants PTA.   Goal of Therapy:  Anti-Xa level 0.6-1 units/ml 4hrs after LMWH dose given Monitor platelets by anticoagulation protocol: Yes   Plan:  1. Lovenox 46m SQ 12H 2. CBC Q72H 3. F/u start of oral anticoagulation  Jennifer Mendoza, RRande Lawman2/28/2015,6:46 PM

## 2014-01-26 NOTE — ED Notes (Signed)
Pt requested food and was provided a [place] sandwich.  Pt stated she needed to go to her car to "lock everything up."  Pt made aware that she was not permitted to leave at this time, and was asked if one of the family members at bedside could complete this task for her.  Both family members agreed to help and were satisfied with the plan.

## 2014-01-26 NOTE — ED Notes (Signed)
Note:  Since pt had one 78m ASA at 1100, she was given 2421m

## 2014-01-26 NOTE — ED Notes (Signed)
Patient to CT.

## 2014-01-26 NOTE — ED Notes (Signed)
Pt sent here from [facility] for further eval of left side chest pain x 2 days, pain is sharp and radiates to left upper back and shoulder. Denies cough, pain increases with movement and breathing.

## 2014-01-26 NOTE — ED Provider Notes (Signed)
Medical screening examination/treatment/procedure(s) were performed by non-physician practitioner and as supervising physician I was immediately available for consultation/collaboration.  Philipp Deputy, M.D.  Harden Mo, MD 01/26/14 2030

## 2014-01-26 NOTE — ED Notes (Signed)
Assisted pt to the restroom to attempt a urine sample, she ambulated well but was unable to void.

## 2014-01-26 NOTE — ED Notes (Signed)
MD at bedside. (Dr. Maricela Bo, Family Medicine)

## 2014-01-27 DIAGNOSIS — S335XXA Sprain of ligaments of lumbar spine, initial encounter: Secondary | ICD-10-CM

## 2014-01-27 DIAGNOSIS — M549 Dorsalgia, unspecified: Secondary | ICD-10-CM

## 2014-01-27 LAB — CBC
HCT: 37.4 % (ref 36.0–46.0)
HEMOGLOBIN: 12.4 g/dL (ref 12.0–15.0)
MCH: 31.2 pg (ref 26.0–34.0)
MCHC: 33.2 g/dL (ref 30.0–36.0)
MCV: 94.2 fL (ref 78.0–100.0)
Platelets: 192 10*3/uL (ref 150–400)
RBC: 3.97 MIL/uL (ref 3.87–5.11)
RDW: 13.2 % (ref 11.5–15.5)
WBC: 7 10*3/uL (ref 4.0–10.5)

## 2014-01-27 MED ORDER — RIVAROXABAN 15 MG PO TABS
15.0000 mg | ORAL_TABLET | Freq: Two times a day (BID) | ORAL | Status: DC
  Filled 2014-01-27 (×2): qty 1

## 2014-01-27 MED ORDER — NICOTINE 21 MG/24HR TD PT24: 21.0000 mg | MEDICATED_PATCH | Freq: Once | TRANSDERMAL | Status: DC

## 2014-01-27 MED ORDER — RIVAROXABAN 20 MG PO TABS: 20.0000 mg | ORAL_TABLET | Freq: Every day | ORAL | Status: DC

## 2014-01-27 MED ORDER — RIVAROXABAN 15 MG PO TABS
15.0000 mg | ORAL_TABLET | Freq: Two times a day (BID) | ORAL | Status: DC
  Administered 2014-01-27: 15 mg via ORAL
  Filled 2014-01-27 (×2): qty 1

## 2014-01-27 MED ORDER — SENNOSIDES-DOCUSATE SODIUM 8.6-50 MG PO TABS: 1.0000 | ORAL_TABLET | Freq: Every day | ORAL | Status: DC

## 2014-01-27 MED ORDER — RIVAROXABAN 15 MG PO TABS: 15.0000 mg | ORAL_TABLET | Freq: Two times a day (BID) | ORAL | Status: DC

## 2014-01-27 MED ORDER — HYDROCODONE-ACETAMINOPHEN 5-325 MG PO TABS: 1.0000 | ORAL_TABLET | ORAL | Status: DC | PRN

## 2014-01-27 NOTE — H&P (Signed)
FMTS ATTENDING ADMISSION NOTE Arwin Bisceglia,MD I  have seen and examined this patient, reviewed their chart. I have discussed this patient with the resident. I agree with the resident's findings, assessment and care plan.  38 Y/O female presented with hx of SOB and sharp chest pain worse with inspiration for the last 1 week, gradually worsening,she denies cough,no sick contact,denies recent travel, her brother was recently diagnosed with clot in his lungs 3 wks ago,otherwise no other family members with clotting disorder.  Filed Vitals:   01/26/14 2009 01/26/14 2010 01/26/14 2051 01/27/14 0418  BP: 106/58 106/58 105/60 93/60  Pulse: 115 111 116 82  Temp:  98.6 F (37 C) 98.4 F (36.9 C) 98.3 F (36.8 C)  TempSrc:  Oral Oral Oral  Resp:  20  19  Height:   5' 5"  (1.651 m)   SpO2: 94% 95% 98% 93%   Exam: Gen: Awake and alert, seated upright in bed,not in distress. Resp: Air entry equal B/L and clear. CV: S1 S2 normal, RRR,no murmurs. Abd: Benign. Ext: No edema.  A/P: 38 Y/O F with  1: Pulmonary embolism: First episode.     Possible risk factor is a brother with VTE.     Patient is stable on Lovenox.     O2 Sat about 94-96% RA.     Not in respiratory distress.     Plan to start on oral anticoagulant for 3-6 months.  Other chronic conditions: Depression, chronic back pain, to continue home regimen.

## 2014-01-27 NOTE — Progress Notes (Signed)
FMTS ATTENDING  NOTE Jennifer Clements,MD I  have seen and examined this patient, reviewed their chart. I have discussed this patient with the resident. I agree with the resident's findings, assessment and care plan. 

## 2014-01-27 NOTE — Progress Notes (Signed)
Patient stated she needs to urinate but is not able to. Bladder scan shows 415 ml in bladder. Patient states she has this problem frequently especially when she is away from home. Patient agreed to try again with all doors closed and water running. Will continue to monitor.

## 2014-01-27 NOTE — Progress Notes (Signed)
Family Medicine Teaching Service Daily Progress Note Intern Pager: [phone]  Patient name: Jennifer Mendoza Medical record number: [id number] Date of birth: Jan 22, 1976 Age: 38 y.o. Gender: female  Primary Care Provider: [name], MD Consultants: none Code Status: Full  Pt Overview and Major Events to Date: 2/28: lovenox treatment dose for PE 3/1: transition to xarelto  Assessment and Plan: Jennifer Mendoza is a 38 y.o. female presenting with pleuritic chest pain and pressure for past 2 days, found to have bilateral lower lobe PE on CT. PMH is significant for chronic low back pain, depression and anxiety.   # Chest pain, secondary to pulmonary emboli, bilateral lower lobes  Presentation with sudden acute onset pleuritic chest pain/tightness x 2 days, initial concern for possible ACS given exertional component. No prior h/o DVT/PE, significant family h/o CAD with MI and PE (ages 67-40s). Initial work-up with elevated D-dimer (1.43), CTA (bilateral lower lobe PE), trop-poct (negative), EKG (sinus tachy). Current episode of PE does not have obvious provoking factor (no OCPs, no immobilization, no malignancy).  - closely monitor vitals / resp status (overnight no O2 req, SpO2 93-98%, continued tachy to 110s)  - Troponin-I neg x 1 - repeat EKG this morning appears unchanged - CBC this AM with hemoglobin 14.3>12.4 (likely dilutional) - on therapeutic anticoagulation - started on lovenox. Transition to Xarelto this morning, 75m BID x 21 days then 239mx 6 months - Given age and first degree relative (brother) with recent bilateral PE, pt will need an evaluation for thrombophilia: antithrombin, protein c and s deficiencies, factor V leiden, and prothrombin gene mutation; this should be performed 2 weeks after cessation of anti-coagulant if possible   # Chronic low back pain  - Continue Norco 5-32571m 1-2 tabs q 4 hr PRN pain   # Depression / Anxiety  - Continue home Paxil-24hr 37.5mg33mily,  Klonopin 1mg 44m PRN   # Tobacco abuse  - chronic h/o smoking, current smoker 2 ppd >20 yrs  - Smoking cessation  - ordered Nicotine patch 21mg 11my  - add-on UDS, Upreg   FEN/GI: saline lock - SLIV  - Protonix 40mg P83mrophylaxis: on therapeutic anticoagulation with Lovenox  Disposition: pending discharge this afternoon  Subjective:  Improved this morning, still has worse pain at the sides of her chest (left > right). Does not currently have CP, SOB. She would like to go home today.  Objective: Temp:  [97.6 F (36.4 C)-98.6 F (37 C)] 98.3 F (36.8 C) (03/01 0418) Pulse Rate:  [82-117] 82 (03/01 0418) Resp:  [16-26] 19 (03/01 0418) BP: (82-116)/(51-90) 93/60 mmHg (03/01 0418) SpO2:  [9 %-100 %] 93 % (03/01 0418) Physical Exam: General: NAD, sitting upright Cardiovascular: RRR, normal s1/s2, no murmurs Respiratory: CTAB, normal effort, pain on deep inspiration Abdomen: soft, ntnd, normal BS Extremities: no edema/cyanosis. WWP.  Laboratory:  Recent Labs Lab 01/26/14 1600 01/27/14 0355  WBC 8.9 7.0  HGB 14.3 12.4  HCT 41.4 37.4  PLT 211 192    Recent Labs Lab 01/26/14 1600  NA 138  K 3.7  CL 95*  CO2 28  BUN 8  CREATININE 0.77  CALCIUM 9.5  PROT 7.8  BILITOT 0.8  ALKPHOS 99  ALT 36*  AST 29  GLUCOSE 99   Imaging/Diagnostic Tests: Dg Chest 2 View  01/26/2014   CLINICAL DATA:  Shortness of breath, right side chest pain  EXAM: CHEST  2 VIEW  COMPARISON:  None.  FINDINGS: Cardiomediastinal silhouette is unremarkable. Central mild  bronchitic changes. There is streaky bilateral basilar airspace disease suspicious for bilateral infiltrates. Follow-up examination to assure resolution is recommended.   IMPRESSION: Central mild bronchitic changes. There is streaky bilateral basilar airspace disease suspicious for bilateral infiltrates. Follow-up examination to assure resolution is recommended.   Electronically Signed   By: Lahoma Crocker M.D.   On: 01/26/2014 16:37    Ct Angio Chest W/cm &/or Wo Cm  01/26/2014   CLINICAL DATA:  Left chest pain radiating to left shoulder, increasing with breathing/ improvement. History of anxiety.  EXAM: CT ANGIOGRAPHY CHEST WITH CONTRAST  TECHNIQUE: Multidetector CT imaging of the chest was performed using the standard protocol during bolus administration of intravenous contrast. Multiplanar CT image reconstructions and MIPs were obtained to evaluate the vascular anatomy.  CONTRAST:  28m OMNIPAQUE IOHEXOL 350 MG/ML SOLN  COMPARISON:  Chest radiographs dated 01/26/2014  FINDINGS: Segmental/subsegmental bilateral lower lobe pulmonary emboli. Overall clot burden is small to moderate. No findings to suggest right heart strain. RV-to-LV ratio 0.91.  Mild patchy bilateral lower lobe opacities, at least some of which is favored to reflect atelectasis, although developing pulmonary infarcts are difficult to exclude. Lower lobe pneumonia is possible but considered less likely. Additional compressive atelectasis in the medial right middle lobe and lingula.  Trace right and small left pleural effusions.  No pneumothorax.  Heart is normal in size.  No pericardial effusion.  No suspicious mediastinal, hilar, or axillary lymphadenopathy.  Visualized upper abdomen is notable for mild hepatic steatosis.  Visualized osseous structures are within normal limits.  Review of the MIP images confirms the above findings.   IMPRESSION: Segmental/subsegmental bilateral lower lobe pulmonary emboli. Overall clot burden is small to moderate.  Mild patchy bilateral lower lobe opacities, at least some of which is favored to reflect atelectasis, although developing pulmonary infarcts are difficult to exclude. Lower lobe pneumonia is possible but considered less likely.  Trace right and small left pleural effusions.  Critical value/emergent results were called by telephone at the time of interpretation on 01/26/2014 at 6:34 PM to Dr. JDorie Rank, who verbally acknowledged  these results.   Electronically Signed   By: SJulian HyM.D.   On: 01/26/2014 18:39    ATawanna Sat MD 01/27/2014, 7:05 AM PGY-1, CClarkstonIntern pager: [phone] text pages welcome

## 2014-01-27 NOTE — Plan of Care (Signed)
Problem: Consults Goal: Diagnosis - Venous Thromboembolism (VTE) Choose a selection Outcome: Completed/Met Date Met:  01/27/14 PE (Pulmonary Embolism)     

## 2014-01-27 NOTE — Discharge Summary (Signed)
[facility] Discharge Summary  Patient name: Jennifer Mendoza Medical record number: [id number] Date of birth: 02-09-1976 Age: 38 y.o. Gender: female Date of Admission: 01/26/2014  Date of Discharge: 01/27/2014 Admitting Physician: Andrena Mews, MD  Primary Care Provider: [name], MD Consultants: none  Indication for Hospitalization: chest pain  Discharge Diagnoses/Problem List: Pulmonary embolism (bilateral lower lobe, low to moderate burden) Chronic low back pain Depression and anxiety Tobacco abuse  Disposition: home  Discharge Condition: stable, improved  Brief Hospital Course:  Jennifer Mendoza is a 38 y.o. female that was admitted for acute chest pain and found to have bilateral PE on CT angiogram. Risk factors only significant for smoking and possibility of family history (brother recently diagnosed with PE). Initial vitals stable with mild tachycardia and O2 sat in mid 90s. Additional workup and labs significant for negative troponins, EKG with sinus tach. She was started on therapeutic anticoagulation with lovenox and monitored overnight without any issues. In the morning she had persistent pain primarily in both of her sides, but otherwise felt okay to go home. She was transitioned to Xarelto 47m BID with planned 21 day course followed by at least 6 months of therapy with 259mdaily.   Issues for Follow Up:  1. Hypercoagulable workup: after stopping xarelto should probably have hypercoag workup given this unprovoked incident and reported history of PE in the family.  2. Tobacco abuse: patient expressed good interest in quitting smoking while in hospital, discharged with nicotine patch prescription.  Significant Procedures: none  Significant Labs and Imaging:   Recent Labs Lab 01/26/14 1600 01/27/14 0355  WBC 8.9 7.0  HGB 14.3 12.4  HCT 41.4 37.4  PLT 211 192    Recent Labs Lab 01/26/14 1600  NA 138  K 3.7  CL 95*  CO2 28   GLUCOSE 99  BUN 8  CREATININE 0.77  CALCIUM 9.5  ALKPHOS 99  AST 29  ALT 36*  ALBUMIN 3.7    CT Angio chest FINDINGS:  Segmental/subsegmental bilateral lower lobe pulmonary emboli.  Overall clot burden is small to moderate. No findings to suggest  right heart strain. RV-to-LV ratio 0.91.  Mild patchy bilateral lower lobe opacities, at least some of which  is favored to reflect atelectasis, although developing pulmonary  infarcts are difficult to exclude. Lower lobe pneumonia is possible  but considered less likely. Additional compressive atelectasis in  the medial right middle lobe and lingula.  Trace right and small left pleural effusions. No pneumothorax.  Heart is normal in size. No pericardial effusion.  No suspicious mediastinal, hilar, or axillary lymphadenopathy.  Visualized upper abdomen is notable for mild hepatic steatosis.  Visualized osseous structures are within normal limits.  Review of the MIP images confirms the above findings.  IMPRESSION:  Segmental/subsegmental bilateral lower lobe pulmonary emboli.  Overall clot burden is small to moderate.  Mild patchy bilateral lower lobe opacities, at least some of which  is favored to reflect atelectasis, although developing pulmonary  infarcts are difficult to exclude. Lower lobe pneumonia is possible  but considered less likely.  Trace right and small left pleural effusions.   Results/Tests Pending at Time of Discharge: none  Discharge Medications:    Medication List    STOP taking these medications       aspirin 325 MG tablet     oxyCODONE-acetaminophen 5-325 MG per tablet  Commonly known as:  PERCOCET/ROXICET      TAKE these medications  clonazePAM 1 MG tablet  Commonly known as:  KLONOPIN  Take 1 mg by mouth 2 (two) times daily as needed for anxiety.     HYDROcodone-acetaminophen 5-325 MG per tablet  Commonly known as:  NORCO/VICODIN  Take 1-2 tablets by mouth every 4 (four) hours as needed  for moderate pain.     nicotine 21 mg/24hr patch  Commonly known as:  NICODERM CQ - dosed in mg/24 hours  Place 1 patch (21 mg total) onto the skin once.     PARoxetine 37.5 MG 24 hr tablet  Commonly known as:  PAXIL-CR  Take 37.5 mg by mouth every morning.     Rivaroxaban 15 MG Tabs tablet  Commonly known as:  XARELTO  Take 1 tablet (15 mg total) by mouth 2 (two) times daily with a meal. Switch to once a day 46m on 02/17/14     Rivaroxaban 20 MG Tabs tablet  Commonly known as:  XARELTO  Take 1 tablet (20 mg total) by mouth daily with supper.  Start taking on:  02/17/2014     senna-docusate 8.6-50 MG per tablet  Commonly known as:  Senokot-S  Take 1 tablet by mouth daily.        Discharge Instructions: Please refer to Patient Instructions section of EMR for full details.  Patient was counseled important signs and symptoms that should prompt return to medical care, changes in medications, dietary instructions, activity restrictions, and follow up appointments.   Follow-Up Appointments:     Follow-up Information   [facility] Schedule an appointment as soon as possible for a visit in 1 week. (For hospital follow up)    Specialty:  Internal Medicine   Contact information:   [address] [id number] MD 01/27/2014, 11:41 AM PGY-1, CReeves

## 2014-01-27 NOTE — Plan of Care (Signed)
Problem: Consults Goal: Diagnosis - Venous Thromboembolism (VTE) Choose a selection PE (Pulmonary Embolism)

## 2014-01-27 NOTE — Progress Notes (Signed)
   CARE MANAGEMENT NOTE 01/27/2014  Patient:  DAHIANA, KULAK   Account Number:  [id number]  Date Initiated:  01/27/2014  Documentation initiated by:  [facility]  Subjective/Objective Assessment:   adm: PE (Pulmonary Embolism)     Action/Plan:   discharge planning   Anticipated DC Date:  01/27/2014   Anticipated DC Plan:  [place]  CM consult  Medication Assistance      Choice offered to / List presented to:             Status of service:  Completed, signed off Medicare Important Message given?   (If response is "NO", the following Medicare IM given date fields will be blank) Date Medicare IM given:   Date Additional Medicare IM given:    Discharge Disposition:  HOME/[place] [facility]  Per UR Regulation:    If discussed at Long Length of Stay Meetings, dates discussed:    Comments:  01/27/14 16:40 CM gave pt free 30 day trial Xarelto card and explained she needs to request this medication be pre-authorized at her follow-up appt.  Pt verbalizes understanding that bc it is the weekend and I am unable to check if Medicaid approves of med.  Pt verbalizes understanding if she has a problem at the pharmacy bc of her Medicaid, she needs to call her MD for another Medication.  No other CM needs were communicated.  Mariane Masters, BSN, CM [phone].

## 2014-01-27 NOTE — Progress Notes (Signed)
Patient called RN to bedside complaining of right leg pain.  RN contacted  Internal Medicine and reported patient symptoms.  MD reported patient plan of care will not change based on new symptoms.  MD instructed RN to proceed with discharge of patient.  Rn notified patient of MD conclusions.  Rn will continue to monitor patient.

## 2014-01-27 NOTE — Discharge Instructions (Addendum)
You were admitted for Pulmonary Embolism in both of your lungs. You were started on blood thinners, initially Lovenox (the shot) and then to Xarelto, which you will go home with. The only risk factors you had for the clot is smoking, and your apparent family history. After you are off the Xarelto you will probably have additional workup to look for other genetic reasons for having the clot.  Reasons to come back to the ED: Increased/worsening chest pain, coughing blood, bleeding that does not stop, worsened shortness of breath  Medication: Xarelto: take 26m twice a day with meals for 21 days. Starting 3/23 take 231monce a day with meals until told to stop by your primary doctor (likely at least 6 months).  Pain management:  Take hydrocodone-acetaminophen 1-2 tablets every 4 hours as needed. This medication will cause constipation, so you are also being prescribed a stool softener (Senokote)  Information on my medicine - XARELTO (rivaroxaban)  This medication education was reviewed with me or my healthcare representative as part of my discharge preparation.  The pharmacist that spoke with me during my hospital stay was:  MaDeboraha SprangRPSahuaritaXarelto was prescribed to treat blood clots that may have been found in the veins of your legs (deep vein thrombosis) or in your lungs (pulmonary embolism) and to reduce the risk of them occurring again.  What do you need to know about Xarelto? The starting dose is one 15 mg tablet taken TWICE daily with food for the FIRST 21 DAYS then on 02/18/14 the dose is changed to one 20 mg tablet taken ONCE A DAY with your evening meal.  DO NOT stop taking Xarelto without talking to the health care provider who prescribed the medication.  Refill your prescription for 20 mg tablets before you run out.  After discharge, you should have regular check-up appointments with your healthcare provider that is prescribing your  Xarelto.  In the future your dose may need to be changed if your kidney function changes by a significant amount.  What do you do if you miss a dose? If you are taking Xarelto TWICE DAILY and you miss a dose, take it as soon as you remember. You may take two 15 mg tablets (total 30 mg) at the same time then resume your regularly scheduled 15 mg twice daily the next day.  If you are taking Xarelto ONCE DAILY and you miss a dose, take it as soon as you remember on the same day then continue your regularly scheduled once daily regimen the next day. Do not take two doses of Xarelto at the same time.   Important Safety Information Xarelto is a blood thinner medicine that can cause bleeding. You should call your healthcare provider right away if you experience any of the following:   Bleeding from an injury or your nose that does not stop.   Unusual colored urine (red or dark brown) or unusual colored stools (red or black).   Unusual bruising for unknown reasons.   A serious fall or if you hit your head (even if there is no bleeding).  Some medicines may interact with Xarelto and might increase your risk of bleeding while on Xarelto. To help avoid this, consult your healthcare provider or pharmacist prior to using any new prescription or non-prescription medications, including herbals, vitamins, non-steroidal anti-inflammatory drugs (NSAIDs) and supplements.  This website has more information on Xarelto: wwOpportunityDebt.at

## 2014-01-29 NOTE — Discharge Summary (Signed)
FMTS ATTENDING  NOTE Kehinde Eniola,MD I  have seen and examined this patient, reviewed their chart. I have discussed this patient with the resident. I agree with the resident's findings, assessment and care plan. 

## 2014-02-08 ENCOUNTER — Telehealth: Payer: Self-pay | Admitting: Family Medicine

## 2014-02-08 NOTE — Telephone Encounter (Signed)
[place] office staff received call from [employer]. There was concern about filling rx for  vicodin given at hospital discharge on 01/27/2014 since patient recently had an refill of percocet 120 pills.  Reviewed chart, I see where patient was instructed to stop percocet and start vicodin. I  do not see why.  Attempted to call patient left VM: plan to continue percocet since PCP was prescribing. Asked patient to call back to let us know if she still ahs percocet or was it discarded.  Called back to [employer]. Spoke to pharmacist and informed him that he should not fill vicodin at this time pending more information.

## 2014-02-15 ENCOUNTER — Other Ambulatory Visit (HOSPITAL_COMMUNITY): Payer: Self-pay | Admitting: Family Medicine

## 2014-06-02 ENCOUNTER — Emergency Department (HOSPITAL_COMMUNITY): Payer: Medicaid Other

## 2014-06-02 ENCOUNTER — Observation Stay (HOSPITAL_COMMUNITY)
Admission: EM | Admit: 2014-06-02 | Discharge: 2014-06-03 | Disposition: A | Discharge status: Home / Self Care | Payer: Medicaid Other | Attending: Internal Medicine | Admitting: Internal Medicine

## 2014-06-02 ENCOUNTER — Encounter (HOSPITAL_COMMUNITY): Payer: Self-pay | Admitting: Emergency Medicine

## 2014-06-02 DIAGNOSIS — J3489 Other specified disorders of nose and nasal sinuses: Secondary | ICD-10-CM

## 2014-06-02 DIAGNOSIS — K219 Gastro-esophageal reflux disease without esophagitis: Secondary | ICD-10-CM | POA: Insufficient documentation

## 2014-06-02 DIAGNOSIS — R0602 Shortness of breath: Secondary | ICD-10-CM | POA: Diagnosis present

## 2014-06-02 DIAGNOSIS — G43009 Migraine without aura, not intractable, without status migrainosus: Secondary | ICD-10-CM | POA: Diagnosis not present

## 2014-06-02 DIAGNOSIS — N200 Calculus of kidney: Secondary | ICD-10-CM

## 2014-06-02 DIAGNOSIS — F3289 Other specified depressive episodes: Secondary | ICD-10-CM | POA: Insufficient documentation

## 2014-06-02 DIAGNOSIS — M25561 Pain in right knee: Secondary | ICD-10-CM

## 2014-06-02 DIAGNOSIS — R079 Chest pain, unspecified: Secondary | ICD-10-CM

## 2014-06-02 DIAGNOSIS — F329 Major depressive disorder, single episode, unspecified: Secondary | ICD-10-CM | POA: Insufficient documentation

## 2014-06-02 DIAGNOSIS — I2699 Other pulmonary embolism without acute cor pulmonale: Secondary | ICD-10-CM

## 2014-06-02 DIAGNOSIS — F411 Generalized anxiety disorder: Secondary | ICD-10-CM

## 2014-06-02 DIAGNOSIS — K589 Irritable bowel syndrome without diarrhea: Secondary | ICD-10-CM | POA: Diagnosis not present

## 2014-06-02 DIAGNOSIS — Z3202 Encounter for pregnancy test, result negative: Secondary | ICD-10-CM | POA: Insufficient documentation

## 2014-06-02 DIAGNOSIS — F410 Panic disorder [episodic paroxysmal anxiety] without agoraphobia: Secondary | ICD-10-CM | POA: Diagnosis not present

## 2014-06-02 DIAGNOSIS — Z79899 Other long term (current) drug therapy: Secondary | ICD-10-CM | POA: Diagnosis not present

## 2014-06-02 DIAGNOSIS — D281 Benign neoplasm of vagina: Secondary | ICD-10-CM

## 2014-06-02 DIAGNOSIS — F17200 Nicotine dependence, unspecified, uncomplicated: Secondary | ICD-10-CM

## 2014-06-02 DIAGNOSIS — G43909 Migraine, unspecified, not intractable, without status migrainosus: Secondary | ICD-10-CM

## 2014-06-02 DIAGNOSIS — R0789 Other chest pain: Secondary | ICD-10-CM | POA: Diagnosis not present

## 2014-06-02 DIAGNOSIS — S39012D Strain of muscle, fascia and tendon of lower back, subsequent encounter: Secondary | ICD-10-CM

## 2014-06-02 DIAGNOSIS — IMO0001 Reserved for inherently not codable concepts without codable children: Secondary | ICD-10-CM | POA: Diagnosis not present

## 2014-06-02 DIAGNOSIS — F341 Dysthymic disorder: Secondary | ICD-10-CM

## 2014-06-02 DIAGNOSIS — F4001 Agoraphobia with panic disorder: Secondary | ICD-10-CM | POA: Insufficient documentation

## 2014-06-02 DIAGNOSIS — R300 Dysuria: Secondary | ICD-10-CM

## 2014-06-02 DIAGNOSIS — S39012A Strain of muscle, fascia and tendon of lower back, initial encounter: Secondary | ICD-10-CM

## 2014-06-02 DIAGNOSIS — M549 Dorsalgia, unspecified: Secondary | ICD-10-CM

## 2014-06-02 LAB — BASIC METABOLIC PANEL
Anion gap: 11 (ref 5–15)
BUN: 12 mg/dL (ref 6–23)
CHLORIDE: 100 meq/L (ref 96–112)
CO2: 27 mEq/L (ref 19–32)
Calcium: 9.1 mg/dL (ref 8.4–10.5)
Creatinine, Ser: 0.88 mg/dL (ref 0.50–1.10)
GFR, EST NON AFRICAN AMERICAN: 82 mL/min — AB (ref >90)
Glucose, Bld: 85 mg/dL (ref 70–99)
POTASSIUM: 4 meq/L (ref 3.7–5.3)
SODIUM: 138 meq/L (ref 137–147)

## 2014-06-02 LAB — CBC
HEMATOCRIT: 39.9 % (ref 36.0–46.0)
Hemoglobin: 13.3 g/dL (ref 12.0–15.0)
MCH: 31.4 pg (ref 26.0–34.0)
MCHC: 33.3 g/dL (ref 30.0–36.0)
MCV: 94.1 fL (ref 78.0–100.0)
PLATELETS: 240 10*3/uL (ref 150–400)
RBC: 4.24 MIL/uL (ref 3.87–5.11)
RDW: 13.4 % (ref 11.5–15.5)
WBC: 7.4 10*3/uL (ref 4.0–10.5)

## 2014-06-02 MED ORDER — MORPHINE SULFATE 4 MG/ML IJ SOLN
4.0000 mg | Freq: Once | INTRAMUSCULAR | Status: AC
  Administered 2014-06-02: 4 mg via INTRAVENOUS
  Filled 2014-06-02: qty 1

## 2014-06-02 MED ORDER — ONDANSETRON HCL 4 MG/2ML IJ SOLN
4.0000 mg | Freq: Once | INTRAMUSCULAR | Status: AC
  Administered 2014-06-02: 4 mg via INTRAVENOUS
  Filled 2014-06-02: qty 2

## 2014-06-02 MED ORDER — IOHEXOL 350 MG/ML SOLN
100.0000 mL | Freq: Once | INTRAVENOUS | Status: AC | PRN
  Administered 2014-06-02: 75 mL via INTRAVENOUS

## 2014-06-02 NOTE — ED Notes (Signed)
The pt has a pain in her  Lt leg since yesterday.  Today she has had lt lat rib pain  That goes upwaqrd and she has had sob.   Hx of a pe in February.  She is currently taking xarelto

## 2014-06-02 NOTE — ED Provider Notes (Signed)
Patient may have her CT angio of chest without prior pregnancy test. Please shield her abdomen  Ephraim Hamburger, MD 06/02/14 2325

## 2014-06-02 NOTE — ED Provider Notes (Signed)
CSN: [id number]     Arrival date & time 06/02/14  2020 History   First MD Initiated Contact with Patient 06/02/14 2118     Chief Complaint  Patient presents with  . Shortness of Breath   38 y/o female with past history of PE in February on Xarelto that presents with sharp, moderate left sided chest pain and SOB, onset 3 days prior that is becoming more intense. Patient also endorses left leg pain that radiaties up the posterior aspect of the leg. She states that the pain is similar but not as severe as to when she had the PE. She denies any recent trauma,  Reports no known cause for the PE in February and states that her brother has similar issues.   (Consider location/radiation/quality/duration/timing/severity/associated sxs/prior Treatment) HPI  Past Medical History  Diagnosis Date  . Depression   . Anxiety   . Tobacco user   . Migraine headache without aura   . Agoraphobia with panic attacks   . GERD (gastroesophageal reflux disease)   . History of IBS   . PANIC ATTACKS 01/26/2007   Past Surgical History  Procedure Laterality Date  . Carotid stent  10/1998    For kidney stones   Family History  Problem Relation Age of Onset  . Osteoporosis Mother    History  Substance Use Topics  . Smoking status: Current Every Day Smoker -- 0.30 packs/day    Types: Cigarettes  . Smokeless tobacco: Never Used     Comment: decreased smoking  . Alcohol Use: No   OB History   Grav Para Term Preterm Abortions TAB SAB Ect Mult Living                 Review of Systems  Constitutional: Negative for activity change.  HENT: Negative for congestion.   Respiratory: Positive for shortness of breath. Negative for cough.   Cardiovascular: Positive for chest pain. Negative for leg swelling.  Gastrointestinal: Negative for nausea, vomiting, abdominal pain, diarrhea, constipation, blood in stool and abdominal distention.  Genitourinary: Negative for dysuria, flank pain and vaginal discharge.   Musculoskeletal: Negative for back pain.       Left leg pain   Skin: Negative for color change.  Neurological: Negative for syncope and headaches.  Psychiatric/Behavioral: Negative for agitation.  All other systems reviewed and are negative.     Allergies  Review of patient's allergies indicates no known allergies.  Home Medications   Prior to Admission medications   Medication Sig Start Date End Date Taking? Authorizing Provider  acetaminophen (TYLENOL) 500 MG tablet Take 500 mg by mouth every 6 (six) hours as needed for headache.   Yes Historical Provider, MD  ALPRAZolam Duanne Moron) 1 MG tablet Take 1 mg by mouth 3 (three) times daily as needed for anxiety.   Yes Historical Provider, MD  Biotin 5000 MCG TABS Take 10,000 mg by mouth daily.   Yes Historical Provider, MD  Coconut Oil OIL Take 5 mLs by mouth daily.   Yes Historical Provider, MD  HYDROcodone-acetaminophen (NORCO/VICODIN) 5-325 MG per tablet Take 1-2 tablets by mouth every 4 (four) hours as needed for moderate pain. 01/27/14  Yes Tawanna Sat, MD  PARoxetine (PAXIL-CR) 37.5 MG 24 hr tablet Take 37.5 mg by mouth every morning.     Yes Historical Provider, MD  Rivaroxaban (XARELTO) 20 MG TABS tablet Take 1 tablet (20 mg total) by mouth daily with supper. 02/17/14  Yes Tawanna Sat, MD   BP 110/72  Pulse 81  Temp(Src) 97.5 F (36.4 C) (Oral)  Resp 14  Ht 5' 5"  (1.651 m)  Wt 155 lb 5 oz (70.449 kg)  BMI 25.85 kg/m2  SpO2 99%  LMP 06/02/2014 Physical Exam  Constitutional: She is oriented to person, place, and time. She appears well-developed.  HENT:  Head: Normocephalic.  Eyes: Pupils are equal, round, and reactive to light.  Neck: Neck supple.  Cardiovascular: Normal rate.  Exam reveals no gallop and no friction rub.   No murmur heard. Pulmonary/Chest: Effort normal and breath sounds normal. No respiratory distress.  Tender to palpation along left chest   Abdominal: Soft. She exhibits no distension. There is no  tenderness. There is no rebound.  Musculoskeletal: She exhibits no edema.  Lower extremities equal with out unilateral swelling of left lower extremity.  Point tender over posterior aspect of calf, not tender along deep vein distribution,  No erythema, good distal pulses.   Neurological: She is alert and oriented to person, place, and time.  Skin: Skin is warm.  Psychiatric: She has a normal mood and affect.    ED Course  Procedures (including critical care time) Labs Review Labs Reviewed  BASIC METABOLIC PANEL - Abnormal; Notable for the following:    GFR calc non Af Amer 82 (*)    All other components within normal limits  CBC  TROPONIN I    Imaging Review Ct Angio Chest Pe W/cm &/or Wo Cm  06/03/2014   CLINICAL DATA:  Left leg pain since yesterday. Now all left lateral rib pain. Shortness of breath. Pulmonary embolus in February.  EXAM: CT ANGIOGRAPHY CHEST WITH CONTRAST  TECHNIQUE: Multidetector CT imaging of the chest was performed using the standard protocol during bolus administration of intravenous contrast. Multiplanar CT image reconstructions and MIPs were obtained to evaluate the vascular anatomy.  CONTRAST:  56m OMNIPAQUE IOHEXOL 350 MG/ML SOLN  COMPARISON:  01/26/2014  FINDINGS: Technically adequate study with good opacification of the central and segmental pulmonary arteries. Previously demonstrated pulmonary emboli have resolved and no new emboli are demonstrated today.  Normal heart size. Normal caliber thoracic aorta. No aortic dissection. Esophagus is decompressed. No significant lymphadenopathy in the chest. Nodular enlargement of the thyroid gland probably representing thyroid goiter. Visualized portions of the upper abdominal organs are grossly unremarkable. No pleural effusions. Patchy infiltration or atelectasis demonstrated in both lung bases. Scattered centrilobular emphysematous changes in the lungs. Airways appear patent. No pneumothorax.  Review of the MIP images  confirms the above findings.  IMPRESSION: No significant residual or recurrent pulmonary embolus. Emphysematous changes in the lungs. Patchy areas of infiltration or atelectasis in both lung bases. Emphysema.   Electronically Signed   By: WLucienne CapersM.D.   On: 06/03/2014 00:09     EKG Interpretation   Date/Time:  Sunday June 02 2014 22:13:15 EDT Ventricular Rate:  76 PR Interval:  137 QRS Duration: 85 QT Interval:  403 QTC Calculation: 453 R Axis:   137 Text Interpretation:  Sinus rhythm Right axis deviation Probable  anteroseptal infarct, old Abnormal T, consider ischemia, lateral leads T  wave inversions in I, AVL are new compared to march 2015 Confirmed by  GOLDSTON  MD, SEatons Neck([id number]) on 06/02/2014 11:25:02 PM      MDM   Final diagnoses:  Chest pain, unspecified chest pain type   38y/o female with recent history of PE on Xarelto present with left leg pain, chest pain and SOB. Described as similar to pain with PE.  Lower extremity examen  and low suspicion for DVT based on exam  SOB and chest pain evaluated with CT angio and EKG. No PE noted on angio. EKG did show new ischemic changes in lateral leads not present on previously. Evaluated with troponin that was negative. Patient reports multiple family members with early MI Dr. Dyann Kief consulted for ACS rule out, and patient was admitted as observation status.     Claudean Severance, MD 06/03/14 [phone]

## 2014-06-02 NOTE — ED Notes (Signed)
Patient presents stating yesterday she noticed her left calf was hurting more than it had been.  Stated she thought she felt a knot in the calf.  Has been on Xalreto since March for a PE.  States her right rib area has been hurting.

## 2014-06-03 ENCOUNTER — Encounter (HOSPITAL_COMMUNITY): Payer: Self-pay | Admitting: *Deleted

## 2014-06-03 ENCOUNTER — Observation Stay (HOSPITAL_COMMUNITY): Payer: Medicaid Other

## 2014-06-03 DIAGNOSIS — F17200 Nicotine dependence, unspecified, uncomplicated: Secondary | ICD-10-CM

## 2014-06-03 DIAGNOSIS — M79609 Pain in unspecified limb: Secondary | ICD-10-CM

## 2014-06-03 DIAGNOSIS — R079 Chest pain, unspecified: Secondary | ICD-10-CM

## 2014-06-03 DIAGNOSIS — I2699 Other pulmonary embolism without acute cor pulmonale: Secondary | ICD-10-CM

## 2014-06-03 DIAGNOSIS — F411 Generalized anxiety disorder: Secondary | ICD-10-CM

## 2014-06-03 LAB — URINALYSIS, ROUTINE W REFLEX MICROSCOPIC
Bilirubin Urine: NEGATIVE
Glucose, UA: NEGATIVE mg/dL
Ketones, ur: NEGATIVE mg/dL
Leukocytes, UA: NEGATIVE
Nitrite: NEGATIVE
PH: 7 (ref 5.0–8.0)
Protein, ur: NEGATIVE mg/dL
SPECIFIC GRAVITY, URINE: 1.045 — AB (ref 1.005–1.030)
Urobilinogen, UA: 0.2 mg/dL (ref 0.0–1.0)

## 2014-06-03 LAB — RAPID URINE DRUG SCREEN, HOSP PERFORMED
AMPHETAMINES: NOT DETECTED
Barbiturates: NOT DETECTED
Benzodiazepines: POSITIVE — AB
Cocaine: NOT DETECTED
OPIATES: POSITIVE — AB
Tetrahydrocannabinol: NOT DETECTED

## 2014-06-03 LAB — CK: Total CK: 65 U/L (ref 7–177)

## 2014-06-03 LAB — URINE MICROSCOPIC-ADD ON

## 2014-06-03 LAB — TROPONIN I
Troponin I: <0.3 ng/mL (ref <0.30)
Troponin I: <0.3 ng/mL (ref <0.30)

## 2014-06-03 LAB — PREGNANCY, URINE: PREG TEST UR: NEGATIVE

## 2014-06-03 MED ORDER — ONDANSETRON HCL 4 MG/2ML IJ SOLN
INTRAMUSCULAR | Status: AC
  Filled 2014-06-03: qty 2

## 2014-06-03 MED ORDER — HYDROCODONE-ACETAMINOPHEN 5-325 MG PO TABS: 1.0000 | ORAL_TABLET | ORAL | Status: DC | PRN

## 2014-06-03 MED ORDER — CYCLOBENZAPRINE HCL 10 MG PO TABS: 10.0000 mg | ORAL_TABLET | Freq: Three times a day (TID) | ORAL | Status: DC | PRN

## 2014-06-03 MED ORDER — CYCLOBENZAPRINE HCL 10 MG PO TABS
10.0000 mg | ORAL_TABLET | Freq: Three times a day (TID) | ORAL | Status: DC | PRN
  Filled 2014-06-03: qty 1

## 2014-06-03 MED ORDER — MORPHINE SULFATE 4 MG/ML IJ SOLN
INTRAMUSCULAR | Status: AC
  Administered 2014-06-03: 4 mg
  Filled 2014-06-03: qty 1

## 2014-06-03 MED ORDER — ASPIRIN EC 81 MG PO TBEC
81.0000 mg | DELAYED_RELEASE_TABLET | Freq: Every day | ORAL | Status: DC
  Administered 2014-06-03: 81 mg via ORAL
  Filled 2014-06-03: qty 1

## 2014-06-03 MED ORDER — RIVAROXABAN 20 MG PO TABS
20.0000 mg | ORAL_TABLET | Freq: Every day | ORAL | Status: DC
  Filled 2014-06-03: qty 1

## 2014-06-03 MED ORDER — MORPHINE SULFATE 4 MG/ML IJ SOLN: 6.0000 mg | Freq: Once | INTRAMUSCULAR | Status: DC

## 2014-06-03 MED ORDER — PAROXETINE HCL ER 37.5 MG PO TB24
37.5000 mg | ORAL_TABLET | Freq: Every day | ORAL | Status: DC
  Administered 2014-06-03: 37.5 mg via ORAL
  Filled 2014-06-03: qty 1

## 2014-06-03 MED ORDER — MORPHINE SULFATE 2 MG/ML IJ SOLN
2.0000 mg | INTRAMUSCULAR | Status: DC | PRN
  Administered 2014-06-03: 2 mg via INTRAVENOUS
  Filled 2014-06-03: qty 1

## 2014-06-03 MED ORDER — TRAMADOL HCL 50 MG PO TABS: 50.0000 mg | ORAL_TABLET | Freq: Four times a day (QID) | ORAL | Status: DC | PRN

## 2014-06-03 MED ORDER — MORPHINE SULFATE 2 MG/ML IJ SOLN
INTRAMUSCULAR | Status: AC
  Administered 2014-06-03: 2 mg via INTRAVENOUS
  Filled 2014-06-03: qty 1

## 2014-06-03 MED ORDER — ONDANSETRON HCL 4 MG/2ML IJ SOLN
4.0000 mg | Freq: Four times a day (QID) | INTRAMUSCULAR | Status: DC | PRN
Start: 2014-06-03 — End: 2014-06-03

## 2014-06-03 MED ORDER — MICONAZOLE NITRATE POWD
Freq: Two times a day (BID) | Status: DC
  Administered 2014-06-03: 11:00:00 via TOPICAL

## 2014-06-03 MED ORDER — ACETAMINOPHEN 325 MG PO TABS: 650.0000 mg | ORAL_TABLET | ORAL | Status: DC | PRN

## 2014-06-03 MED ORDER — ALPRAZOLAM 0.5 MG PO TABS: 1.0000 mg | ORAL_TABLET | Freq: Three times a day (TID) | ORAL | Status: DC | PRN

## 2014-06-03 MED ORDER — ONDANSETRON HCL 4 MG/2ML IJ SOLN
4.0000 mg | Freq: Once | INTRAMUSCULAR | Status: AC
  Administered 2014-06-03: 4 mg via INTRAVENOUS

## 2014-06-03 NOTE — Progress Notes (Signed)
Patient ID: Jennifer Mendoza  female  GTX:[id number]    DOB: 12-25-1975    DOA: 06/02/2014  PCP: [name], MD  Assessment/Plan: Principal Problem:   Chest pain - Atypical, past medical history of bilateral PE, smoker, cardiology was consulted and recommended stress echocardiogram. - Ruled out for acute ACS  - Currently on xarelto for PE diagnosed on 01/26/14, will continue  Active Problems:   ANXIETY: - Continue psych meds    Chronic low back pain /Lumbar strain - X-rays of the hip negative, MRI of the lower back ordered -Continue Flexeril, pain control - Recommended patient to followup with PCP for further management     Pulmonary embolism, bilateral: Was unprovoked in March 2015 - Continue xarelto for 6 months, hypercoagulable panel once patient has completed xarelto, deferred to PCP - Patient has point tenderness over the posterior aspect of the calf, left lower extremity, she is on xarelto and low likelihood of DVT but patient wants to rule out, hence ordered duplex venous LLE  Smoking:  - Counseled on smoking cessation  Microscopic hematuria - No UTI on UA, she is on xarelto, recommended patient to followup with PCP, if she has any frank hematuria, will need to stop xarelto and place IVC filter. Urology consultation if needed outpatient, defer to PCP   DVT Prophylaxis:On xarelto  Code Status:  Family Communication:Family member at bedside  [place] home today if all the imaging are completed  Consultants:  Cardiology  Procedures:  Stress echo  Antibiotics:  None    Subjective: Patient seen and examined, multiple complaints, no active chest pain or shortness of breath  Objective: Weight change:   Intake/Output Summary (Last 24 hours) at 06/03/14 1006 Last data filed at 06/03/14 0426  Gross per 24 hour  Intake      0 ml  Output    250 ml  Net   -250 ml   Blood pressure 111/79, pulse 72, temperature 97.8 F (36.6 C), temperature  source Oral, resp. rate 20, height 5' 5"  (1.651 m), weight 70.035 kg (154 lb 6.4 oz), last menstrual period 06/02/2014, SpO2 97.00%.  Physical Exam: General: Alert and awake, oriented x3, not in any acute distress. HEENT: anicteric sclera, PERLA, EOMI CVS: S1-S2 clear, no murmur rubs or gallops Chest: clear to auscultation bilaterally, no wheezing, rales or rhonchi Abdomen: soft nontender, nondistended, normal bowel sounds  Extremities: no cyanosis, clubbing or edema noted bilaterally, Point tenderness over the posterior calf on left lower extremity  Neuro: Cranial nerves II-XII intact, no focal neurological deficits  Lab Results: Basic Metabolic Panel:  Recent Labs Lab 06/02/14 2217  NA 138  K 4.0  CL 100  CO2 27  GLUCOSE 85  BUN 12  CREATININE 0.88  CALCIUM 9.1   Liver Function Tests: No results found for this basename: AST, ALT, ALKPHOS, BILITOT, PROT, ALBUMIN,  in the last 168 hours No results found for this basename: LIPASE, AMYLASE,  in the last 168 hours No results found for this basename: AMMONIA,  in the last 168 hours CBC:  Recent Labs Lab 06/02/14 2217  WBC 7.4  HGB 13.3  HCT 39.9  MCV 94.1  PLT 240   Cardiac Enzymes:  Recent Labs Lab 06/02/14 2342 06/03/14 0500 06/03/14 0841  CKTOTAL  --  65  --   TROPONINI <0.30 <0.30 <0.30   BNP: No components found with this basename: POCBNP,  CBG: No results found for this basename: GLUCAP,  in the last 168 hours   Micro  Results: No results found for this or any previous visit (from the past 240 hour(s)).  Studies/Results: Dg Hip Complete Left  06/03/2014   CLINICAL DATA:  Left hip pain to the knee. Pain worsening for 3 days.  EXAM: LEFT HIP - COMPLETE 2+ VIEW  COMPARISON:  None.  FINDINGS: There is no evidence of hip fracture or dislocation. There is no evidence of arthropathy or other focal bone abnormality. Residual contrast material in the urinary tract.  IMPRESSION: Negative.   Electronically Signed    By: Lucienne Capers M.D.   On: 06/03/2014 03:09   Ct Angio Chest Pe W/cm &/or Wo Cm  06/03/2014   CLINICAL DATA:  Left leg pain since yesterday. Now all left lateral rib pain. Shortness of breath. Pulmonary embolus in February.  EXAM: CT ANGIOGRAPHY CHEST WITH CONTRAST  TECHNIQUE: Multidetector CT imaging of the chest was performed using the standard protocol during bolus administration of intravenous contrast. Multiplanar CT image reconstructions and MIPs were obtained to evaluate the vascular anatomy.  CONTRAST:  26m OMNIPAQUE IOHEXOL 350 MG/ML SOLN  COMPARISON:  01/26/2014  FINDINGS: Technically adequate study with good opacification of the central and segmental pulmonary arteries. Previously demonstrated pulmonary emboli have resolved and no new emboli are demonstrated today.  Normal heart size. Normal caliber thoracic aorta. No aortic dissection. Esophagus is decompressed. No significant lymphadenopathy in the chest. Nodular enlargement of the thyroid gland probably representing thyroid goiter. Visualized portions of the upper abdominal organs are grossly unremarkable. No pleural effusions. Patchy infiltration or atelectasis demonstrated in both lung bases. Scattered centrilobular emphysematous changes in the lungs. Airways appear patent. No pneumothorax.  Review of the MIP images confirms the above findings.  IMPRESSION: No significant residual or recurrent pulmonary embolus. Emphysematous changes in the lungs. Patchy areas of infiltration or atelectasis in both lung bases. Emphysema.   Electronically Signed   By: WLucienne CapersM.D.   On: 06/03/2014 00:09    Medications: Scheduled Meds: . aspirin EC  81 mg Oral Daily  . miconazole nitrate   Topical BID  .  morphine injection  6 mg Intravenous Once  . ondansetron      . PARoxetine  37.5 mg Oral Daily  . rivaroxaban  20 mg Oral Q supper      LOS: 1 day   Miana Politte M.D. [facility] 06/03/2014, 10:06 AM Pager: [phone] If  7PM-7AM, please contact night-coverage [email or web address] Password [email or web address]  **Disclaimer: This note was dictated with voice recognition software. Similar sounding words can inadvertently be transcribed and this note may contain transcription errors which may not have been corrected upon publication of note.**

## 2014-06-03 NOTE — Progress Notes (Signed)
ANTICOAGULATION CONSULT NOTE - Initial Consult  Pharmacy Consult for Xarelto Indication: pulmonary embolus  No Known Allergies  Patient Measurements: Height: 5' 5"  (165.1 cm) Weight: 154 lb 6.4 oz (70.035 kg) IBW/kg (Calculated) : 57  Vital Signs: Temp: 97.8 F (36.6 C) (07/06 0400) Temp src: Oral (07/06 0400) BP: 111/79 mmHg (07/06 0400) Pulse Rate: 72 (07/06 0400)  Labs:  Recent Labs  06/02/14 2217 06/02/14 2342  HGB 13.3  --   HCT 39.9  --   PLT 240  --   CREATININE 0.88  --   TROPONINI  --  <0.30    Estimated Creatinine Clearance: 85.1 ml/min (by C-G formula based on Cr of 0.88).   Medical History: Past Medical History  Diagnosis Date  . Depression   . Anxiety   . Tobacco user   . Migraine headache without aura   . Agoraphobia with panic attacks   . GERD (gastroesophageal reflux disease)   . History of IBS   . PANIC ATTACKS 01/26/2007    Medications:  Prescriptions prior to admission  Medication Sig Dispense Refill  . acetaminophen (TYLENOL) 500 MG tablet Take 500 mg by mouth every 6 (six) hours as needed for headache.      . ALPRAZolam (XANAX) 1 MG tablet Take 1 mg by mouth 3 (three) times daily as needed for anxiety.      . Biotin 5000 MCG TABS Take 10,000 mg by mouth daily.      . Coconut Oil OIL Take 5 mLs by mouth daily.      Marland Kitchen HYDROcodone-acetaminophen (NORCO/VICODIN) 5-325 MG per tablet Take 1-2 tablets by mouth every 4 (four) hours as needed for moderate pain.  20 tablet  0  . PARoxetine (PAXIL-CR) 37.5 MG 24 hr tablet Take 37.5 mg by mouth every morning.        . Rivaroxaban (XARELTO) 20 MG TABS tablet Take 1 tablet (20 mg total) by mouth daily with supper.  30 tablet  5   Scheduled:  . aspirin EC  81 mg Oral Daily  . miconazole nitrate   Topical BID  .  morphine injection  6 mg Intravenous Once  . ondansetron      . PARoxetine  37.5 mg Oral Daily  . rivaroxaban  20 mg Oral Q supper    Assessment: 38yo female c/o left-sided CP and SOB  as well as LLE pain similar but not as severe as when she had acute PE in March, exam shows low suspicion for DVT, CT shows no significant new or recurrent PE, to continue Xarelto.   Plan:  Pt on appropriate dose of Xarelto 74m daily at evening meal, will continue.  VWynona Neat PharmD, BCPS  06/03/2014,5:52 AM

## 2014-06-03 NOTE — Consult Note (Signed)
CARDIOLOGY CONSULT NOTE       Patient ID: Jennifer Mendoza MRN: [id number] DOB/AGE: 1976-04-19 38 y.o.  Admit date: 06/02/2014 Referring Physician:  Tana [place] Primary Physician: [name], MD Primary Cardiologist:   Reason for Consultation:  Chest Pain  Principal Problem:   Chest pain Active Problems:   ANXIETY   Lumbar strain   Pulmonary embolism, bilateral   HPI:   Jennifer Mendoza is a 38 y.o. female with Past medical history of bilateral pulmonary embolism, GERD, anxiety, depression, smoker.  Patient presented with complaints of primarily back pain with some radiation to chest  She mentions she has chronic back pain worse in am . Since last 3 days patient has been having progressively worsening back pain and also primarily located on the left gluteal region pain. Her left gluteal region pain was initially radiating to her left thigh and left knee. Today the pain was more severe and was also radiating to her left chest. She also has some substernal chest pain associated with nausea and shortness of breath. She complains of occasional headache.  She denies any change in her medication and she denies any recent travel. She has been taking her medication regularly. She mentions she smokes 1-2 cigarettes a day. She denies any alcohol abuse or drug abuse.  She denies any fever chills cough diarrhea or burning urination.  No pain this am.  Pain was not pleuritic      ROS All other systems reviewed and negative except as noted above  Past Medical History  Diagnosis Date  . Depression   . Anxiety   . Tobacco user   . Migraine headache without aura   . Agoraphobia with panic attacks   . GERD (gastroesophageal reflux disease)   . History of IBS   . PANIC ATTACKS 01/26/2007    Family History  Problem Relation Age of Onset  . Osteoporosis Mother     History   Social History  . Marital Status: Married    Spouse Name: N/A    Number of Children: N/A  . Years of Education: N/A    Occupational History  . Not on file.   Social History Main Topics  . Smoking status: Current Every Day Smoker -- 0.30 packs/day    Types: Cigarettes  . Smokeless tobacco: Never Used     Comment: decreased smoking  . Alcohol Use: No  . Drug Use: No  . Sexual Activity: Yes    Birth Control/ Protection: None   Other Topics Concern  . Not on file   Social History Narrative   Pt with 3 kids (50 y/o girl 75, 29 y/o boy Jennifer Mendoza, 38 y/o Jennifer Mendoza)   Daughter died at 90 weeks in 2023/10/18 from gastric aspiration; pt used to go to [employer], but did not find it helpful.  I've recommended Jennifer Mendoza, but pt has not made the call.    Husband and pt living together again.   Smokes 5-6 cig to 1/2 ppd.    No alcohol,Nno drugs    Past Surgical History  Procedure Laterality Date  . Carotid stent  10/1998    For kidney stones     . aspirin EC  81 mg Oral Daily  . miconazole nitrate   Topical BID  .  morphine injection  6 mg Intravenous Once  . ondansetron      . PARoxetine  37.5 mg Oral Daily  . rivaroxaban  20 mg Oral Q supper      Physical Exam:  Blood pressure 111/79, pulse 72, temperature 97.8 F (36.6 C), temperature source Oral, resp. rate 20, height 5' 5"  (1.651 m), weight 154 lb 6.4 oz (70.035 kg), last menstrual period 06/02/2014, SpO2 97.00%.   Affect appropriate Healthy:  appears stated age 19: normal Neck supple with no adenopathy JVP normal no bruits no thyromegaly Lungs clear with no wheezing and good diaphragmatic motion Heart:  S1/S2 no murmur, no rub, gallop or click PMI normal Abdomen: benighn, BS positve, no tenderness, no AAA no bruit.  No HSM or HJR Distal pulses intact with no bruits No edema Neuro non-focal Skin warm and dry No muscular weakness   Labs:   Lab Results  Component Value Date   WBC 7.4 06/02/2014   HGB 13.3 06/02/2014   HCT 39.9 06/02/2014   MCV 94.1 06/02/2014   PLT 240 06/02/2014    Recent Labs Lab 06/02/14 2217  NA 138  K 4.0   CL 100  CO2 27  BUN 12  CREATININE 0.88  CALCIUM 9.1  GLUCOSE 85   Lab Results  Component Value Date   CKTOTAL 65 06/03/2014   TROPONINI <0.30 06/03/2014    No results found for this basename: CHOL   No results found for this basename: HDL   No results found for this basename: LDLCALC   No results found for this basename: TRIG   No results found for this basename: CHOLHDL   No results found for this basename: LDLDIRECT      Radiology: Dg Hip Complete Left  06/03/2014   CLINICAL DATA:  Left hip pain to the knee. Pain worsening for 3 days.  EXAM: LEFT HIP - COMPLETE 2+ VIEW  COMPARISON:  None.  FINDINGS: There is no evidence of hip fracture or dislocation. There is no evidence of arthropathy or other focal bone abnormality. Residual contrast material in the urinary tract.  IMPRESSION: Negative.   Electronically Signed   By: Lucienne Capers M.D.   On: 06/03/2014 03:09   Ct Angio Chest Pe W/cm &/or Wo Cm  06/03/2014   CLINICAL DATA:  Left leg pain since yesterday. Now all left lateral rib pain. Shortness of breath. Pulmonary embolus in February.  EXAM: CT ANGIOGRAPHY CHEST WITH CONTRAST  TECHNIQUE: Multidetector CT imaging of the chest was performed using the standard protocol during bolus administration of intravenous contrast. Multiplanar CT image reconstructions and MIPs were obtained to evaluate the vascular anatomy.  CONTRAST:  66m OMNIPAQUE IOHEXOL 350 MG/ML SOLN  COMPARISON:  01/26/2014  FINDINGS: Technically adequate study with good opacification of the central and segmental pulmonary arteries. Previously demonstrated pulmonary emboli have resolved and no new emboli are demonstrated today.  Normal heart size. Normal caliber thoracic aorta. No aortic dissection. Esophagus is decompressed. No significant lymphadenopathy in the chest. Nodular enlargement of the thyroid gland probably representing thyroid goiter. Visualized portions of the upper abdominal organs are grossly unremarkable.  No pleural effusions. Patchy infiltration or atelectasis demonstrated in both lung bases. Scattered centrilobular emphysematous changes in the lungs. Airways appear patent. No pneumothorax.  Review of the MIP images confirms the above findings.  IMPRESSION: No significant residual or recurrent pulmonary embolus. Emphysematous changes in the lungs. Patchy areas of infiltration or atelectasis in both lung bases. Emphysema.   Electronically Signed   By: WLucienne CapersM.D.   On: 06/03/2014 00:09    EKG:  SR limb lead reversal othewise normal    ASSESSMENT AND PLAN:  Chest Pain:  Atypical r/o already had CT to r/o PE  Can walk  Stress echo ordered Low pre test probability of disease Back Pain:  Seems to be primary issue r/o sciatica consider MRI  Plain films unrevealing Smoking:  Counseled for less than 10 minutes on cessation little motivation to quit PE:  History of CT no new consider checking anticardiolipin antibodies  Lupus anticoagulant and hypercoag panel given family history   Signed: Jenkins Rouge 06/03/2014, 8:14 AM

## 2014-06-03 NOTE — Progress Notes (Signed)
VASCULAR LAB PRELIMINARY  PRELIMINARY  PRELIMINARY  PRELIMINARY  Left lower extremity venous Doppler completed.    Preliminary report:  There is no DVT or SVT noted in the left lower extremity.   Keagan Anthis, RVT 06/03/2014, 1:15 PM

## 2014-06-03 NOTE — Progress Notes (Signed)
Pt discharged home with family.  Reviewed discharge orders and instructions, all questions answered.  Assessment unchanged from earlier.

## 2014-06-03 NOTE — Progress Notes (Signed)
  Echocardiogram Echocardiogram Stress Test has been performed.  Jennifer Mendoza 06/03/2014, 10:14 AM

## 2014-06-03 NOTE — Discharge Summary (Signed)
Physician Discharge Summary  Patient ID: Jennifer Mendoza MRN: [id number] DOB/AGE: November 04, 1976 38 y.o.  Admit date: 06/02/2014 Discharge date: 06/03/2014  Primary Care Physician:  [name], MD  Discharge Diagnoses:    . atypical Chest pain . ANXIETY   Acute on chronic Back pain  . history of Pulmonary embolism, bilateral  Consults: Cardiology, Dr. Johnsie Cancel   Recommendations for Outpatient Follow-up:  Patient has microscopic hematuria, recommend outpatient urology referral if she has any frank hematuria. She's currently on xarelto, needed for bilateral pulmonary embolism hence no acute interventions for microscopic hematuria were done.  Patient will need hypercoagulability panel once she completes xarelto.   Allergies:  No Known Allergies   Discharge Medications:   Medication List         acetaminophen 500 MG tablet  Commonly known as:  TYLENOL  Take 500 mg by mouth every 6 (six) hours as needed for headache.     ALPRAZolam 1 MG tablet  Commonly known as:  XANAX  Take 1 mg by mouth 3 (three) times daily as needed for anxiety.     Biotin 5000 MCG Tabs  Take 10,000 mg by mouth daily.     Coconut Oil Oil  Take 5 mLs by mouth daily.     cyclobenzaprine 10 MG tablet  Commonly known as:  FLEXERIL  Take 1 tablet (10 mg total) by mouth 3 (three) times daily as needed for muscle spasms.     HYDROcodone-acetaminophen 5-325 MG per tablet  Commonly known as:  NORCO/VICODIN  Take 1-2 tablets by mouth every 4 (four) hours as needed for moderate pain.     PARoxetine 37.5 MG 24 hr tablet  Commonly known as:  PAXIL-CR  Take 37.5 mg by mouth every morning.     rivaroxaban 20 MG Tabs tablet  Commonly known as:  XARELTO  Take 1 tablet (20 mg total) by mouth daily with supper.     traMADol 50 MG tablet  Commonly known as:  ULTRAM  Take 1 tablet (50 mg total) by mouth every 6 (six) hours as needed for moderate pain.         Brief H and P: For complete details please refer  to admission H and P, but in brief Jennifer Mendoza is a 38 y.o. female with Past medical history of bilateral pulmonary embolism, GERD, anxiety, depression, smoker.  Patient presented with complaints of chest pain. She mentioned  she has chronic back pain which has been waking her up in the morning. Since last 3 days patient has been having progressively worsening back pain and also primarily located on the left gluteal region pain. Her left gluteal region pain was initially radiating to her left thigh and left knee. Today the pain was more severe and was also radiating to her left chest. She also has some substernal chest pain associated with nausea and shortness of breath. She complained of occasional headache.  She denied any changes in her medication and she denies any recent travel. She mentioned that she smokes 1-2 cigarettes a day. She denied any alcohol abuse or drug abuse.  She denied any fever chills cough diarrhea or burning urination.  She mentioned her last menstrual period has been ongoing.  She mentioned that she has brother who has also history of PE as well as coronary artery disease with possible CABG. And she also mentions about multiple cousins having heart attack in their 55s and 44s.   Hospital Course:  Chest pain - Atypical, past medical history  of bilateral PE, smoker, cardiology was consulted and recommended stress echocardiogram. Patient was ruled out for acute ACS with negative troponins.  Cardiology was consulted, as patient has abnormal EKG changes but according to cardiology, Dr Johnsie Cancel, sinus rhythm limbal lead reversal otherwise normal. Patient underwent stress echocardiogram which was negative for ischemia, no echocardiographic evidence for stress-induced ischemia, low-risk stress echo, EF 65%, poor exercise tolerance. Likely chest pain is also a musculoskeletal.  ANXIETY: - Continue psych meds   Acute on chronic low back pain /Lumbar strain  - X-rays of the hip  negative, MRI of the lower back was done which showed no significant lumbar spine disc protrusion or foraminal stenosis.  Patient was placed on Flexeril and pain control, she was recommended to followup with PCP for further management.   Pulmonary embolism, bilateral: Was unprovoked in March 2015   Continue xarelto for 6 months, hypercoagulable panel once patient has completed xarelto, deferred to PCP   Patient has point tenderness over the posterior aspect of the calf, left lower extremity, she is on xarelto and low likelihood of DVT but patient wants to rule out, hence ordered duplex venous LLE which was negative for any DVT or SVT.  Smoking: - Counseled on smoking cessation   Microscopic hematuria  - No UTI on UA, she is on xarelto, recommended patient to followup with PCP, if she has any frank hematuria, will need to stop xarelto and place IVC filter. Urology consultation if needed outpatient, defer to PCP       Day of Discharge BP 111/79  Pulse 72  Temp(Src) 97.8 F (36.6 C) (Oral)  Resp 20  Ht 5' 5"  (1.651 m)  Wt 70.035 kg (154 lb 6.4 oz)  BMI 25.69 kg/m2  SpO2 97%  LMP 06/02/2014  Physical Exam: General: Alert and awake oriented x3 not in any acute distress. HEENT: anicteric sclera, pupils reactive to light and accommodation CVS: S1-S2 clear no murmur rubs or gallops Chest: clear to auscultation bilaterally, no wheezing rales or rhonchi Abdomen: soft nontender, nondistended, normal bowel sounds Extremities: no cyanosis, clubbing or edema noted bilaterally Neuro: Cranial nerves II-XII intact, no focal neurological deficits   The results of significant diagnostics from this hospitalization (including imaging, microbiology, ancillary and laboratory) are listed below for reference.    LAB RESULTS: Basic Metabolic Panel:  Recent Labs Lab 06/02/14 2217  NA 138  K 4.0  CL 100  CO2 27  GLUCOSE 85  BUN 12  CREATININE 0.88  CALCIUM 9.1   Liver Function Tests: No  results found for this basename: AST, ALT, ALKPHOS, BILITOT, PROT, ALBUMIN,  in the last 168 hours No results found for this basename: LIPASE, AMYLASE,  in the last 168 hours No results found for this basename: AMMONIA,  in the last 168 hours CBC:  Recent Labs Lab 06/02/14 2217  WBC 7.4  HGB 13.3  HCT 39.9  MCV 94.1  PLT 240   Cardiac Enzymes:  Recent Labs Lab 06/03/14 0500 06/03/14 0841 06/03/14 1113  CKTOTAL 65  --   --   TROPONINI <0.30 <0.30 <0.30   BNP: No components found with this basename: POCBNP,  CBG: No results found for this basename: GLUCAP,  in the last 168 hours  Significant Diagnostic Studies:  Dg Hip Complete Left  06/03/2014   CLINICAL DATA:  Left hip pain to the knee. Pain worsening for 3 days.  EXAM: LEFT HIP - COMPLETE 2+ VIEW  COMPARISON:  None.  FINDINGS: There is no evidence of  hip fracture or dislocation. There is no evidence of arthropathy or other focal bone abnormality. Residual contrast material in the urinary tract.  IMPRESSION: Negative.   Electronically Signed   By: Lucienne Capers M.D.   On: 06/03/2014 03:09   Ct Angio Chest Pe W/cm &/or Wo Cm  06/03/2014   CLINICAL DATA:  Left leg pain since yesterday. Now all left lateral rib pain. Shortness of breath. Pulmonary embolus in February.  EXAM: CT ANGIOGRAPHY CHEST WITH CONTRAST  TECHNIQUE: Multidetector CT imaging of the chest was performed using the standard protocol during bolus administration of intravenous contrast. Multiplanar CT image reconstructions and MIPs were obtained to evaluate the vascular anatomy.  CONTRAST:  86m OMNIPAQUE IOHEXOL 350 MG/ML SOLN  COMPARISON:  01/26/2014  FINDINGS: Technically adequate study with good opacification of the central and segmental pulmonary arteries. Previously demonstrated pulmonary emboli have resolved and no new emboli are demonstrated today.  Normal heart size. Normal caliber thoracic aorta. No aortic dissection. Esophagus is decompressed. No significant  lymphadenopathy in the chest. Nodular enlargement of the thyroid gland probably representing thyroid goiter. Visualized portions of the upper abdominal organs are grossly unremarkable. No pleural effusions. Patchy infiltration or atelectasis demonstrated in both lung bases. Scattered centrilobular emphysematous changes in the lungs. Airways appear patent. No pneumothorax.  Review of the MIP images confirms the above findings.  IMPRESSION: No significant residual or recurrent pulmonary embolus. Emphysematous changes in the lungs. Patchy areas of infiltration or atelectasis in both lung bases. Emphysema.   Electronically Signed   By: WLucienne CapersM.D.   On: 06/03/2014 00:09    Stress echocardiogram Study Conclusions  - Stress ECG conclusions: Pretest ECG was abnormal with inferolateral ST changes Actually improved with exercise There were no stress arrhythmias or conduction abnormalities. The stress ECG was negative for ischemia. - Staged echo: There was no echocardiographic evidence for stress-induced ischemia. - Impressions: Low risk stress echo. Normal resting and stress images with appropriate decrease in LV cavity size and EF imcnreased to 65% Stress 4 chamber images suboptimal but remainder of images were diagnositic Baseline ECG abnormal with poor exercise tolerance.  Impressions:  - Low risk stress echo. Normal resting and stress images with appropriate decrease in LV cavity size and EF imcnreased to 65% Stress 4 chamber images suboptimal but remainder of images were diagnositic Baseline ECG abnormal with poor exercise tolerance.    Disposition and Follow-up:    DISPOSITION: home  DIET:regular     DISCHARGE FOLLOW-UP Follow-up Information   Follow up with GBarbette Merino MD. Schedule an appointment as soon as possible for a visit in 10 days. (for hospital follow-up)    Specialty:  Internal Medicine   Contact information:   [address] [place] [phone]      Time spent on Discharge: 40 mins  Signed:   Gabrielle Wakeland M.D. [facility] 06/03/2014, 1:07 PM Pager: [phone]  **Disclaimer: This note was dictated with voice recognition software. Similar sounding words can inadvertently be transcribed and this note may contain transcription errors which may not have been corrected upon publication of note.**

## 2014-06-03 NOTE — Progress Notes (Signed)
Utilization Review Completed.Donne Anon T7/04/2014

## 2014-06-03 NOTE — H&P (Signed)
[facility] History and Physical  Patient: Jennifer Mendoza  ZOX:[id number]  DOB: 1976/09/12  DOS: the patient was seen and examined on 06/03/2014 PCP: [name], MD  Chief Complaint: Chest pain, left hip pain  HPI: Babe L Cui is a 38 y.o. female with Past medical history of bilateral pulmonary embolism, GERD, anxiety, depression, smoker. Patient presented with complaints of chest pain. She mentions she has chronic back pain which she has been she wakes up in the morning. Since last 3 days patient has been having progressively worsening back pain and also primarily located on the left gluteal region pain. Her left gluteal region pain was initially radiating to her left thigh and left knee. Today the pain was more severe and was also radiating to her left chest. She also has some substernal chest pain associated with nausea and shortness of breath. She complains of occasional headache. She denies any change in her medication and she denies any recent travel. She has been taking her medication regularly. She mentions she smokes 1-2 cigarettes a day. She denies any alcohol abuse or drug abuse. She denies any fever chills cough diarrhea or burning urination. She mentions her last menstrual period has been ongoing. She mentions that she has brother who has also history of PE as well as coronary artery disease with possible CABG. And she also mentions about multiple cousins having heart attack in their 70s and 43s.  The patient is coming from home. And at her baseline independent for most of her ADL.  Review of Systems: as mentioned in the history of present illness.  A Comprehensive review of the other systems is negative.  Past Medical History  Diagnosis Date  . Depression   . Anxiety   . Tobacco user   . Migraine headache without aura   . Agoraphobia with panic attacks   . GERD (gastroesophageal reflux disease)   . History of IBS   . PANIC ATTACKS 01/26/2007   Past Surgical  History  Procedure Laterality Date  . Carotid stent  10/1998    For kidney stones   Social History:  reports that she has been smoking Cigarettes.  She has been smoking about 0.30 packs per day. She has never used smokeless tobacco. She reports that she does not drink alcohol or use illicit drugs.  No Known Allergies  Family History  Problem Relation Age of Onset  . Osteoporosis Mother     Prior to Admission medications   Medication Sig Start Date End Date Taking? Authorizing Provider  acetaminophen (TYLENOL) 500 MG tablet Take 500 mg by mouth every 6 (six) hours as needed for headache.   Yes Historical Provider, MD  ALPRAZolam Duanne Moron) 1 MG tablet Take 1 mg by mouth 3 (three) times daily as needed for anxiety.   Yes Historical Provider, MD  Biotin 5000 MCG TABS Take 10,000 mg by mouth daily.   Yes Historical Provider, MD  Coconut Oil OIL Take 5 mLs by mouth daily.   Yes Historical Provider, MD  HYDROcodone-acetaminophen (NORCO/VICODIN) 5-325 MG per tablet Take 1-2 tablets by mouth every 4 (four) hours as needed for moderate pain. 01/27/14  Yes Tawanna Sat, MD  PARoxetine (PAXIL-CR) 37.5 MG 24 hr tablet Take 37.5 mg by mouth every morning.     Yes Historical Provider, MD  Rivaroxaban (XARELTO) 20 MG TABS tablet Take 1 tablet (20 mg total) by mouth daily with supper. 02/17/14  Yes Tawanna Sat, MD    Physical Exam: Filed Vitals:   06/02/14 8119  06/03/14 0028 06/03/14 0100 06/03/14 0245  BP: 108/74 115/78 123/84 120/83  Pulse: 70 78 79 90  Temp:      TempSrc:      Resp: 20 15 21    Height:      Weight:      SpO2: 97% 99% 96% 98%    General: Alert, Awake and Oriented to Time, Place and Person. Appear in mild distress Eyes: PERRL ENT: Oral Mucosa clear moist. Neck: No JVD Cardiovascular: S1 and S2 Present, no Murmur, Peripheral Pulses Present Respiratory: Bilateral Air entry equal and Decreased, Clear to Auscultation,  No Crackles, no wheezes Abdomen: Bowel Sound Present, Soft  and Non tender Skin: No Rash Extremities: Tenderness at left lower back, potentially CVA tenderness. Tenderness at left gluteal region and trochanteric region. Range of motion and straight leg raising test negative bilaterally No Pedal edema, left calf tenderness Neurologic: Grossly no focal neuro deficit.  Labs on Admission:  CBC:  Recent Labs Lab 06/02/14 2217  WBC 7.4  HGB 13.3  HCT 39.9  MCV 94.1  PLT 240    CMP     Component Value Date/Time   NA 138 06/02/2014 2217   K 4.0 06/02/2014 2217   CL 100 06/02/2014 2217   CO2 27 06/02/2014 2217   GLUCOSE 85 06/02/2014 2217   BUN 12 06/02/2014 2217   CREATININE 0.88 06/02/2014 2217   CALCIUM 9.1 06/02/2014 2217   PROT 7.8 01/26/2014 1600   ALBUMIN 3.7 01/26/2014 1600   AST 29 01/26/2014 1600   ALT 36* 01/26/2014 1600   ALKPHOS 99 01/26/2014 1600   BILITOT 0.8 01/26/2014 1600   GFRNONAA 82* 06/02/2014 2217   GFRAA >90 06/02/2014 2217    No results found for this basename: LIPASE, AMYLASE,  in the last 168 hours No results found for this basename: AMMONIA,  in the last 168 hours   Recent Labs Lab 06/02/14 2342  TROPONINI <0.30   BNP (last 3 results) No results found for this basename: PROBNP,  in the last 8760 hours  Radiological Exams on Admission: Dg Hip Complete Left  06/03/2014   CLINICAL DATA:  Left hip pain to the knee. Pain worsening for 3 days.  EXAM: LEFT HIP - COMPLETE 2+ VIEW  COMPARISON:  None.  FINDINGS: There is no evidence of hip fracture or dislocation. There is no evidence of arthropathy or other focal bone abnormality. Residual contrast material in the urinary tract.  IMPRESSION: Negative.   Electronically Signed   By: Lucienne Capers M.D.   On: 06/03/2014 03:09   Ct Angio Chest Pe W/cm &/or Wo Cm  06/03/2014   CLINICAL DATA:  Left leg pain since yesterday. Now all left lateral rib pain. Shortness of breath. Pulmonary embolus in February.  EXAM: CT ANGIOGRAPHY CHEST WITH CONTRAST  TECHNIQUE: Multidetector CT imaging of  the chest was performed using the standard protocol during bolus administration of intravenous contrast. Multiplanar CT image reconstructions and MIPs were obtained to evaluate the vascular anatomy.  CONTRAST:  27m OMNIPAQUE IOHEXOL 350 MG/ML SOLN  COMPARISON:  01/26/2014  FINDINGS: Technically adequate study with good opacification of the central and segmental pulmonary arteries. Previously demonstrated pulmonary emboli have resolved and no new emboli are demonstrated today.  Normal heart size. Normal caliber thoracic aorta. No aortic dissection. Esophagus is decompressed. No significant lymphadenopathy in the chest. Nodular enlargement of the thyroid gland probably representing thyroid goiter. Visualized portions of the upper abdominal organs are grossly unremarkable. No pleural effusions. Patchy infiltration or  atelectasis demonstrated in both lung bases. Scattered centrilobular emphysematous changes in the lungs. Airways appear patent. No pneumothorax.  Review of the MIP images confirms the above findings.  IMPRESSION: No significant residual or recurrent pulmonary embolus. Emphysematous changes in the lungs. Patchy areas of infiltration or atelectasis in both lung bases. Emphysema.   Electronically Signed   By: Lucienne Capers M.D.   On: 06/03/2014 00:09   EKG: Independently reviewed. normal sinus rhythm, T-wave inversion n 1 aVL.  Assessment/Plan Active Problems:   ANXIETY   Lumbar strain   Pulmonary embolism, bilateral   Chest pain   1. Chest pain Patient presents with complaints of chest pain or shortness of breath. Her EKG is found to have T-wave inversion in lead 1 and aVL. Her troponins are negative a CT chest PE is negative for any pulmonary embolism. She does not appear to have any pneumonia or pneumothorax. At present she will be admitted to the hospital because of left-sided EKG changes and complaints of chest pain. Serial troponin I would keep her n.p.o. monitor on telemetry  echocardiogram in the morning and possible stress test. Check U. tox for cocaine.  2. Pulmonary embolism CT chest PE is negative for pulmonary embolism. She continues to complain of cramping pain in her left leg. We will get duplex of the left leg and also continue her on Xarelto.  3. Anxiety. Continue Xanax.  4. Lumbar strain. HEENT x-ray is negative for any acute abnormality. She does not have any spinal or paraspinal tenderness. We give her Flexeril for muscle relaxant.  DVT Prophylaxis: Chronic anticoagulation  Nutrition: N.p.o. after midnight  Code Status: Full  Family Communication: Daughter was present at bedside, opportunity was given to ask question and all questions were answered satisfactorily at the time of interview. Disposition: Admitted to observation in telemetry unit.  Author: Berle Mull, MD [facility] Pager: [phone] 06/03/2014, 3:27 AM    If 7PM-7AM, please contact night-coverage [email or web address] Password [email or web address]  **Disclaimer: This note may have been dictated with voice recognition software. Similar sounding words can inadvertently be transcribed and this note may contain transcription errors which may not have been corrected upon publication of note.**

## 2014-06-03 NOTE — Progress Notes (Signed)
Stress echo performed.   Poor exercise tolerance, very SOB w/ exertion, no chest pain. Reached target, images obtained. Final report pending.

## 2014-06-03 NOTE — ED Provider Notes (Signed)
I saw and evaluated the patient, reviewed the resident's note and I agree with the findings and plan.   EKG Interpretation   Date/Time:  Sunday June 02 2014 22:13:15 EDT Ventricular Rate:  76 PR Interval:  137 QRS Duration: 85 QT Interval:  403 QTC Calculation: 453 R Axis:   137 Text Interpretation:  Sinus rhythm Right axis deviation Probable  anteroseptal infarct, old Abnormal T, consider ischemia, lateral leads T  wave inversions in I, AVL are new compared to march 2015 Confirmed by  Jayquan Bradsher  MD, Joylyn Duggin [id number]) on 06/02/2014 11:25:02 PM       Patient with atypical CP with new T wave changes in I, AVL. Not present when she had her PE a few months ago. No PE now. Will admit for ACS r/o  Ephraim Hamburger, MD 06/03/14 1213

## 2014-07-12 ENCOUNTER — Emergency Department (HOSPITAL_COMMUNITY): Payer: Medicaid Other

## 2014-07-12 ENCOUNTER — Encounter (HOSPITAL_COMMUNITY): Payer: Self-pay | Admitting: Emergency Medicine

## 2014-07-12 ENCOUNTER — Emergency Department (HOSPITAL_COMMUNITY)
Admission: EM | Admit: 2014-07-12 | Discharge: 2014-07-12 | Disposition: A | Discharge status: Home / Self Care | Payer: Medicaid Other | Attending: Emergency Medicine | Admitting: Emergency Medicine

## 2014-07-12 DIAGNOSIS — F3289 Other specified depressive episodes: Secondary | ICD-10-CM | POA: Diagnosis not present

## 2014-07-12 DIAGNOSIS — Z79899 Other long term (current) drug therapy: Secondary | ICD-10-CM | POA: Insufficient documentation

## 2014-07-12 DIAGNOSIS — Z7901 Long term (current) use of anticoagulants: Secondary | ICD-10-CM | POA: Diagnosis not present

## 2014-07-12 DIAGNOSIS — F411 Generalized anxiety disorder: Secondary | ICD-10-CM | POA: Insufficient documentation

## 2014-07-12 DIAGNOSIS — Y9389 Activity, other specified: Secondary | ICD-10-CM | POA: Diagnosis not present

## 2014-07-12 DIAGNOSIS — F329 Major depressive disorder, single episode, unspecified: Secondary | ICD-10-CM | POA: Diagnosis not present

## 2014-07-12 DIAGNOSIS — Y92410 Unspecified street and highway as the place of occurrence of the external cause: Secondary | ICD-10-CM | POA: Insufficient documentation

## 2014-07-12 DIAGNOSIS — S32009A Unspecified fracture of unspecified lumbar vertebra, initial encounter for closed fracture: Secondary | ICD-10-CM | POA: Insufficient documentation

## 2014-07-12 DIAGNOSIS — IMO0002 Reserved for concepts with insufficient information to code with codable children: Secondary | ICD-10-CM | POA: Insufficient documentation

## 2014-07-12 DIAGNOSIS — R1110 Vomiting, unspecified: Secondary | ICD-10-CM | POA: Insufficient documentation

## 2014-07-12 DIAGNOSIS — Z87891 Personal history of nicotine dependence: Secondary | ICD-10-CM | POA: Insufficient documentation

## 2014-07-12 DIAGNOSIS — S32000A Wedge compression fracture of unspecified lumbar vertebra, initial encounter for closed fracture: Secondary | ICD-10-CM

## 2014-07-12 DIAGNOSIS — G43909 Migraine, unspecified, not intractable, without status migrainosus: Secondary | ICD-10-CM | POA: Diagnosis not present

## 2014-07-12 DIAGNOSIS — Z8719 Personal history of other diseases of the digestive system: Secondary | ICD-10-CM | POA: Insufficient documentation

## 2014-07-12 MED ORDER — TRAMADOL HCL 50 MG PO TABS
50.0000 mg | ORAL_TABLET | Freq: Once | ORAL | Status: AC
  Administered 2014-07-12: 50 mg via ORAL
  Filled 2014-07-12: qty 1

## 2014-07-12 MED ORDER — LORAZEPAM 1 MG PO TABS
1.0000 mg | ORAL_TABLET | Freq: Once | ORAL | Status: AC
Start: 2014-07-12 — End: 2014-07-12
  Administered 2014-07-12: 1 mg via ORAL
  Filled 2014-07-12: qty 2

## 2014-07-12 MED ORDER — OXYCODONE-ACETAMINOPHEN 5-325 MG PO TABS: 1.0000 | ORAL_TABLET | Freq: Four times a day (QID) | ORAL | Status: DC | PRN
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
## 2014-07-12 NOTE — ED Notes (Signed)
Patient states she is unable to urinate at this time.

## 2014-07-12 NOTE — ED Notes (Signed)
Patient discharged with all personal belongings.

## 2014-07-13 NOTE — ED Provider Notes (Signed)
Medical screening examination/treatment/procedure(s) were performed by non-physician practitioner and as supervising physician I was immediately available for consultation/collaboration.   EKG Interpretation None       Orlie Dakin, MD 07/13/14 2213

## 2015-01-30 ENCOUNTER — Emergency Department (HOSPITAL_COMMUNITY): Payer: Medicaid Other

## 2015-01-30 ENCOUNTER — Encounter (HOSPITAL_COMMUNITY): Payer: Self-pay | Admitting: Emergency Medicine

## 2015-01-30 ENCOUNTER — Emergency Department (HOSPITAL_COMMUNITY)
Admission: EM | Admit: 2015-01-30 | Discharge: 2015-01-30 | Disposition: A | Discharge status: Home / Self Care | Payer: Medicaid Other | Attending: Emergency Medicine | Admitting: Emergency Medicine

## 2015-01-30 DIAGNOSIS — F329 Major depressive disorder, single episode, unspecified: Secondary | ICD-10-CM | POA: Diagnosis not present

## 2015-01-30 DIAGNOSIS — Z720 Tobacco use: Secondary | ICD-10-CM | POA: Insufficient documentation

## 2015-01-30 DIAGNOSIS — Z8719 Personal history of other diseases of the digestive system: Secondary | ICD-10-CM | POA: Diagnosis not present

## 2015-01-30 DIAGNOSIS — Z7901 Long term (current) use of anticoagulants: Secondary | ICD-10-CM | POA: Diagnosis not present

## 2015-01-30 DIAGNOSIS — M25572 Pain in left ankle and joints of left foot: Secondary | ICD-10-CM | POA: Diagnosis not present

## 2015-01-30 DIAGNOSIS — Z79899 Other long term (current) drug therapy: Secondary | ICD-10-CM | POA: Diagnosis not present

## 2015-01-30 DIAGNOSIS — Z8669 Personal history of other diseases of the nervous system and sense organs: Secondary | ICD-10-CM | POA: Diagnosis not present

## 2015-01-30 DIAGNOSIS — M79609 Pain in unspecified limb: Secondary | ICD-10-CM | POA: Diagnosis not present

## 2015-01-30 DIAGNOSIS — F410 Panic disorder [episodic paroxysmal anxiety] without agoraphobia: Secondary | ICD-10-CM | POA: Diagnosis not present

## 2015-01-30 MED ORDER — IBUPROFEN 800 MG PO TABS: 800.0000 mg | ORAL_TABLET | Freq: Three times a day (TID) | ORAL | Status: DC | PRN

## 2015-01-30 MED ORDER — OXYCODONE-ACETAMINOPHEN 5-325 MG PO TABS: 1.0000 | ORAL_TABLET | ORAL | Status: DC | PRN

## 2015-01-30 MED ORDER — IBUPROFEN 800 MG PO TABS
800.0000 mg | ORAL_TABLET | Freq: Once | ORAL | Status: AC
  Administered 2015-01-30: 800 mg via ORAL
  Filled 2015-01-30: qty 1

## 2015-01-30 NOTE — Progress Notes (Signed)
VASCULAR LAB PRELIMINARY  PRELIMINARY  PRELIMINARY  PRELIMINARY  Left lower extremity venous duplex completed.    Preliminary report:  Left:  No evidence of DVT, superficial thrombosis, or Baker's cyst.  Kekoa Fyock, RVS 01/30/2015, 1:43 PM

## 2015-01-30 NOTE — Discharge Instructions (Signed)
Your x-ray shows no fracture or dislocation. Your ultrasound of your leg showed no blood clot. Your pain may be secondary to a sprain or tendinitis. This would be treated with elevation, rest, ice and anti-inflammatories such as ibuprofen. If your pain continues I recommend you follow-up with your primary care physician. There is no sign of infection or gout currently but if you develop significant redness, warmth of the joint, worsening pain, fever, please return to the emergency department.   Ankle Pain Ankle pain is a common symptom. The bones, cartilage, tendons, and muscles of the ankle joint perform a lot of work each day. The ankle joint holds your body weight and allows you to move around. Ankle pain can occur on either side or back of 1 or both ankles. Ankle pain may be sharp and burning or dull and aching. There may be tenderness, stiffness, redness, or warmth around the ankle. The pain occurs more often when a person walks or puts pressure on the ankle. CAUSES  There are many reasons ankle pain can develop. It is important to work with your caregiver to identify the cause since many conditions can impact the bones, cartilage, muscles, and tendons. Causes for ankle pain include:  Injury, including a break (fracture), sprain, or strain often due to a fall, sports, or a high-impact activity.  Swelling (inflammation) of a tendon (tendonitis).  Achilles tendon rupture.  Ankle instability after repeated sprains and strains.  Poor foot alignment.  Pressure on a nerve (tarsal tunnel syndrome).  Arthritis in the ankle or the lining of the ankle.  Jaleeyah formation in the ankle (gout or pseudogout). DIAGNOSIS  A diagnosis is based on your medical history, your symptoms, results of your physical exam, and results of diagnostic tests. Diagnostic tests may include X-ray exams or a computerized magnetic scan (magnetic resonance imaging, MRI). TREATMENT  Treatment will depend on the cause of  your ankle pain and may include:  Keeping pressure off the ankle and limiting activities.  Using crutches or other walking support (a cane or brace).  Using rest, ice, compression, and elevation.  Participating in physical therapy or home exercises.  Wearing shoe inserts or special shoes.  Losing weight.  Taking medications to reduce pain or swelling or receiving an injection.  Undergoing surgery. HOME CARE INSTRUCTIONS   Only take over-the-counter or prescription medicines for pain, discomfort, or fever as directed by your caregiver.  Put ice on the injured area.  Put ice in a plastic bag.  Place a towel between your skin and the bag.  Leave the ice on for 15-20 minutes at a time, 03-04 times a day.  Keep your leg raised (elevated) when possible to lessen swelling.  Avoid activities that cause ankle pain.  Follow specific exercises as directed by your caregiver.  Record how often you have ankle pain, the location of the pain, and what it feels like. This information may be helpful to you and your caregiver.  Ask your caregiver about returning to work or sports and whether you should drive.  Follow up with your caregiver for further examination, therapy, or testing as directed. SEEK MEDICAL CARE IF:   Pain or swelling continues or worsens beyond 1 week.  You have an oral temperature above 102 F (38.9 C).  You are feeling unwell or have chills.  You are having an increasingly difficult time with walking.  You have loss of sensation or other new symptoms.  You have questions or concerns. MAKE SURE YOU:  Understand these instructions.  Will watch your condition.  Will get help right away if you are not doing well or get worse. Document Released: 05/05/2010 Document Revised: 02/07/2012 Document Reviewed: 05/05/2010 [facility] Patient Information 2015 [place], [place]. This information is not intended to replace advice given to you by your health care provider.  Make sure you discuss any questions you have with your health care provider.   RICE: Routine Care for Injuries The routine care of many injuries includes Rest, Ice, Compression, and Elevation (RICE). HOME CARE INSTRUCTIONS  Rest is needed to allow your body to heal. Routine activities can usually be resumed when comfortable. Injured tendons and bones can take up to 6 weeks to heal. Tendons are the cord-like structures that attach muscle to bone.  Ice following an injury helps keep the swelling down and reduces pain.  Put ice in a plastic bag.  Place a towel between your skin and the bag.  Leave the ice on for 15-20 minutes, 3-4 times a day, or as directed by your health care provider. Do this while awake, for the first 24 to 48 hours. After that, continue as directed by your caregiver.  Compression helps keep swelling down. It also gives support and helps with discomfort. If an elastic bandage has been applied, it should be removed and reapplied every 3 to 4 hours. It should not be applied tightly, but firmly enough to keep swelling down. Watch fingers or toes for swelling, bluish discoloration, coldness, numbness, or excessive pain. If any of these problems occur, remove the bandage and reapply loosely. Contact your caregiver if these problems continue.  Elevation helps reduce swelling and decreases pain. With extremities, such as the arms, hands, legs, and feet, the injured area should be placed near or above the level of the heart, if possible. SEEK IMMEDIATE MEDICAL CARE IF:  You have persistent pain and swelling.  You develop redness, numbness, or unexpected weakness.  Your symptoms are getting worse rather than improving after several days. These symptoms may indicate that further evaluation or further X-rays are needed. Sometimes, X-rays may not show a small broken bone (fracture) until 1 week or 10 days later. Make a follow-up appointment with your caregiver. Ask when your X-ray  results will be ready. Make sure you get your X-ray results. Document Released: 02/27/2001 Document Revised: 11/20/2013 Document Reviewed: 04/16/2011 [facility] Patient Information 2015 [place], [place]. This information is not intended to replace advice given to you by your health care provider. Make sure you discuss any questions you have with your health care provider.
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
## 2015-01-30 NOTE — ED Notes (Signed)
Ortho called to fit for crutches.

## 2015-01-30 NOTE — ED Notes (Signed)
Pt reports pain And tenderness in l/ankle x 4 days. Area around l/ankle is red and swollen. Pt reports that ankle has been painful to touch, with radiating pain to knee x 3 days. Denies fever or shortness of breath

## 2015-01-30 NOTE — ED Notes (Signed)
Vascular study at bedside.

## 2015-05-23 ENCOUNTER — Emergency Department (HOSPITAL_COMMUNITY): Payer: Medicaid Other

## 2015-05-23 ENCOUNTER — Encounter (HOSPITAL_COMMUNITY): Payer: Self-pay | Admitting: Physical Medicine and Rehabilitation

## 2015-05-23 ENCOUNTER — Emergency Department (HOSPITAL_COMMUNITY)
Admission: EM | Admit: 2015-05-23 | Discharge: 2015-05-23 | Disposition: A | Discharge status: Home / Self Care | Payer: Medicaid Other | Attending: Emergency Medicine | Admitting: Emergency Medicine

## 2015-05-23 DIAGNOSIS — M79604 Pain in right leg: Secondary | ICD-10-CM | POA: Diagnosis not present

## 2015-05-23 DIAGNOSIS — Z8679 Personal history of other diseases of the circulatory system: Secondary | ICD-10-CM | POA: Insufficient documentation

## 2015-05-23 DIAGNOSIS — F419 Anxiety disorder, unspecified: Secondary | ICD-10-CM | POA: Diagnosis not present

## 2015-05-23 DIAGNOSIS — Z79899 Other long term (current) drug therapy: Secondary | ICD-10-CM | POA: Insufficient documentation

## 2015-05-23 DIAGNOSIS — F329 Major depressive disorder, single episode, unspecified: Secondary | ICD-10-CM | POA: Insufficient documentation

## 2015-05-23 DIAGNOSIS — R079 Chest pain, unspecified: Secondary | ICD-10-CM | POA: Diagnosis present

## 2015-05-23 DIAGNOSIS — Z720 Tobacco use: Secondary | ICD-10-CM | POA: Diagnosis not present

## 2015-05-23 DIAGNOSIS — Z8719 Personal history of other diseases of the digestive system: Secondary | ICD-10-CM | POA: Insufficient documentation

## 2015-05-23 DIAGNOSIS — R0789 Other chest pain: Secondary | ICD-10-CM | POA: Insufficient documentation

## 2015-05-23 DIAGNOSIS — R0602 Shortness of breath: Secondary | ICD-10-CM | POA: Diagnosis not present

## 2015-05-23 LAB — CBC WITH DIFFERENTIAL/PLATELET
Basophils Absolute: 0 10*3/uL (ref 0.0–0.1)
Basophils Relative: 0 % (ref 0–1)
Eosinophils Absolute: 0.2 10*3/uL (ref 0.0–0.7)
Eosinophils Relative: 2 % (ref 0–5)
HCT: 41.9 % (ref 36.0–46.0)
Hemoglobin: 14.1 g/dL (ref 12.0–15.0)
Lymphocytes Relative: 25 % (ref 12–46)
Lymphs Abs: 2.3 10*3/uL (ref 0.7–4.0)
MCH: 31.1 pg (ref 26.0–34.0)
MCHC: 33.7 g/dL (ref 30.0–36.0)
MCV: 92.3 fL (ref 78.0–100.0)
Monocytes Absolute: 0.4 10*3/uL (ref 0.1–1.0)
Monocytes Relative: 5 % (ref 3–12)
Neutro Abs: 6.5 10*3/uL (ref 1.7–7.7)
Neutrophils Relative %: 68 % (ref 43–77)
Platelets: 255 10*3/uL (ref 150–400)
RBC: 4.54 MIL/uL (ref 3.87–5.11)
RDW: 12.9 % (ref 11.5–15.5)
WBC: 9.5 10*3/uL (ref 4.0–10.5)

## 2015-05-23 LAB — COMPREHENSIVE METABOLIC PANEL
ALT: 27 U/L (ref 14–54)
AST: 23 U/L (ref 15–41)
Albumin: 3.6 g/dL (ref 3.5–5.0)
Alkaline Phosphatase: 74 U/L (ref 38–126)
Anion gap: 8 (ref 5–15)
BUN: 11 mg/dL (ref 6–20)
CO2: 25 mmol/L (ref 22–32)
Calcium: 9 mg/dL (ref 8.9–10.3)
Chloride: 105 mmol/L (ref 101–111)
Creatinine, Ser: 0.92 mg/dL (ref 0.44–1.00)
GFR calc Af Amer: >60 mL/min (ref >60)
GFR calc non Af Amer: >60 mL/min (ref >60)
Glucose, Bld: 118 mg/dL — ABNORMAL HIGH (ref 65–99)
Potassium: 4.1 mmol/L (ref 3.5–5.1)
Sodium: 138 mmol/L (ref 135–145)
Total Bilirubin: 0.5 mg/dL (ref 0.3–1.2)
Total Protein: 6.9 g/dL (ref 6.5–8.1)

## 2015-05-23 LAB — I-STAT TROPONIN, ED: Troponin i, poc: 0 ng/mL (ref 0.00–0.08)

## 2015-05-23 LAB — D-DIMER, QUANTITATIVE (NOT AT ARMC): D-Dimer, Quant: 0.56 ug/mL-FEU — ABNORMAL HIGH (ref 0.00–0.48)

## 2015-05-23 MED ORDER — IOHEXOL 350 MG/ML SOLN
100.0000 mL | Freq: Once | INTRAVENOUS | Status: AC | PRN
  Administered 2015-05-23: 100 mL via INTRAVENOUS

## 2015-05-23 MED ORDER — OXYCODONE-ACETAMINOPHEN 5-325 MG PO TABS
1.0000 | ORAL_TABLET | Freq: Once | ORAL | Status: AC
  Administered 2015-05-23: 1 via ORAL
  Filled 2015-05-23: qty 1

## 2015-05-23 NOTE — ED Provider Notes (Signed)
CSN: [id number]     Arrival date & time 05/23/15  1124 History   First MD Initiated Contact with Patient 05/23/15 1233     Chief Complaint  Patient presents with  . Chest Pain  . Shortness of Breath  . 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 Chest pain actually starts in her left abdomen and radiates into her inferior right and left anterior inferior chest. Pain is sharp in nature and worse with inspiration and with movement. She denies shortness of breath until she takes a deep breath in and the pain catches her breath. She denies any radiation of pain into her left arm, neck or back. She denies any associated diaphoresis, nausea, or vomiting. Patient took some aspirin this morning. She completed her Xarelto after 6 months from her PE (September 2015). Although her PE was deemed unprovoked, she does not believe she has had a coagulopathy work up.   She denies any history of malignancy, hemoptysis, immobilization, recent surgery, or leg swelling.    Past Medical History  Diagnosis Date  . Depression   . Anxiety   . Tobacco user   . Migraine headache without aura   . Agoraphobia with panic attacks   . GERD (gastroesophageal reflux disease)   . History of IBS   . PANIC ATTACKS 01/26/2007   Past Surgical History  Procedure Laterality Date  . Carotid stent  10/1998    For kidney stones   Family History  Problem Relation Age of Onset  . Osteoporosis Mother   . Cancer Father   . Hypertension Father    History  Substance Use Topics  . Smoking status: Current Every Day Smoker -- 0.50 packs/day    Types: Cigarettes  . Smokeless tobacco: Never Used     Comment: Only 1-2 cigarettes since PE in March 2015  . Alcohol Use: No   OB History    No  data available     Review of Systems General: Denies fever, chills, fatigue, and diaphoresis.  Respiratory: Denies SOB, cough, hemoptysis, chest tightness, and wheezing.   Cardiovascular: Admits to pleuritic chest pain. Denies palpitations.  Gastrointestinal: Denies nausea, vomiting, abdominal pain, diarrhea Musculoskeletal: Admits to right leg pain. Denies lower extremity swelling.   Neurological: Denies dizziness, headaches, weakness, lightheadedness, syncope Psychiatric/Behavioral: Denies confusion  Allergies  Review of patient's allergies indicates no known allergies.  Home Medications   Prior to Admission medications   Medication Sig Start Date End Date Taking? Authorizing Provider  alprazolam Duanne Moron) 2 MG tablet Take 2 mg by mouth 4 (four) times daily as needed for anxiety.  01/23/15   Historical Provider, MD  Biotin 5000 MCG TABS Take 5,000 mg by mouth daily.     Historical Provider, MD  Coconut Oil OIL Take 15 mLs by mouth daily.     Historical Provider, MD  ibuprofen (ADVIL,MOTRIN) 800 MG tablet Take 1 tablet (800 mg total) by mouth every 8 (eight) hours as needed for mild pain. 01/30/15   Kristen N Ward, DO  oxyCODONE-acetaminophen (PERCOCET/ROXICET) 5-325 MG per tablet Take 1 tablet by mouth every 4 (four) hours as needed. 01/30/15   [place], DO   Physical Exam  Filed Vitals:   05/23/15 1345 05/23/15 1415 05/23/15  1430 05/23/15 1500  BP: 87/57 97/62 97/68  98/75  Pulse: 80 84 77 75  Temp:      TempSrc:      Resp: 18 20 16 21   Height:      Weight:      SpO2: 90% 92% 94% 92%   General: Vital signs reviewed.  Patient is well-developed and well-nourished, in no acute distress and cooperative with exam. .  Cardiovascular: RRR, S1 normal, S2 normal, no murmurs, gallops, or rubs. Some reproducible chest pain with palpation of chest wall.  Pulmonary/Chest: Clear to auscultation bilaterally, no wheezes, rales, or rhonchi. Pain with deep inspiration. Abdominal: Soft,  non-tender, non-distended, BS + Extremities: No lower extremity edema bilaterally, pulses symmetric and intact bilaterally. No evidence of erythema or increased warmth. Non-tender on palpation of right proximal lateral thigh. Psychiatric: Normal mood and affect. speech and behavior is normal. Cognition and memory are normal.    ED Course  Procedures (including critical care time) Labs Review Labs Reviewed  COMPREHENSIVE METABOLIC PANEL - Abnormal; Notable for the following:    Glucose, Bld 118 (*)    All other components within normal limits  D-DIMER, QUANTITATIVE (NOT AT [facility]) - Abnormal; Notable for the following:    D-Dimer, Quant 0.56 (*)    All other components within normal limits  CBC WITH DIFFERENTIAL/PLATELET  Randolm Idol, ED    Imaging Review Dg Chest 2 View  05/23/2015   CLINICAL DATA:  Chest pain for 1 day  EXAM: CHEST  2 VIEW  COMPARISON:  Chest radiograph January 26, 2014 and chest CT June 02, 2014  FINDINGS: There is patchy lower lobe atelectasis bilaterally. Lungs elsewhere clear. Heart size and pulmonary vascularity are normal. No adenopathy. No bone lesions.  IMPRESSION: Patchy bilateral lower lobe atelectatic change. No edema or consolidation.   Electronically Signed   By: Lowella Grip III M.D.   On: 05/23/2015 12:48     EKG Interpretation   Date/Time:  Friday May 23 2015 11:57:12 EDT Ventricular Rate:  100 PR Interval:  136 QRS Duration: 68 QT Interval:  352 QTC Calculation: 768 R Axis:   68 Text Interpretation:  Normal sinus rhythm Septal infarct , age  undetermined Abnormal ECG Confirmed by Wilson Singer  MD, STEPHEN ([id number]) on  05/23/2015 1:03:55 PM      MDM   Final diagnoses:  None   39 yo female with PMHx of PE presenting with leg pain and chest pain. EKG NSR without signs of ischemia. CBC and CMET within normal limits, CXR showed patchy bilateral lower lobe atelectatic change. No edema or Consolidation. Troponin negative. Well's score 1.5 (low  probability) given previous DVT/PE; however, no clinical symptoms of  DVT, no tachycardia, no recent immobilization or surgery, no h/o malignancy and no hemoptysis. Patient is low probability/PE is unlikely. D-dimer checked which was elevated at 0.56 ug/mL. CTA Chest ordered.  Case was discussed in full with ED attending physician, Dr. Wilson Singer and signed out to Dr. Tawnya Crook.   Osa Craver, DO PGY-1 Internal Medicine Resident Pager # [phone] 05/23/2015 3:36 PM     Alexa Sherral Hammers, MD 05/23/15 [place], MD 05/28/15 [phone]

## 2015-05-23 NOTE — ED Notes (Signed)
Pt taken from triage to xray

## 2015-05-23 NOTE — ED Notes (Signed)
Transported to cT

## 2015-05-23 NOTE — ED Notes (Signed)
Pt presents to department for evaluation of L sided chest pain and SOB. Also states R sided leg pain. Respirations unlabored at the time. History of PE. Pt is alert and oriented x4.

## 2015-05-23 NOTE — Discharge Instructions (Signed)

## 2015-05-23 NOTE — ED Provider Notes (Signed)
CTA negative. BP milyl low, but in chart review 100systolic appears to be baseline. Will d/c home with instructions for close outpatient PCP follow-up in coagulopathy workup.  Return precautions given for new or worsening symptoms including worsening pain, fever, shortness of breath.   1. Atypical chest pain      Ernestina Patches, MD 05/24/15 1113

## 2016-06-09 IMAGING — MR MR LUMBAR SPINE W/O CM
4 of 5 series · 19 of 48 positions shown · non-contrast
Comparison: None.

CLINICAL DATA: Chronic back pain

EXAM:
MRI LUMBAR SPINE WITHOUT CONTRAST
TECHNIQUE: Multiplanar, multisequence MR imaging of the lumbar spine was
performed. No intravenous contrast was administered.

[Series 300: T2 · sagittal · 4.0mm · 0.55mm/px · 6 of 14 slices shown (1 of 2)]
[im 1/14]
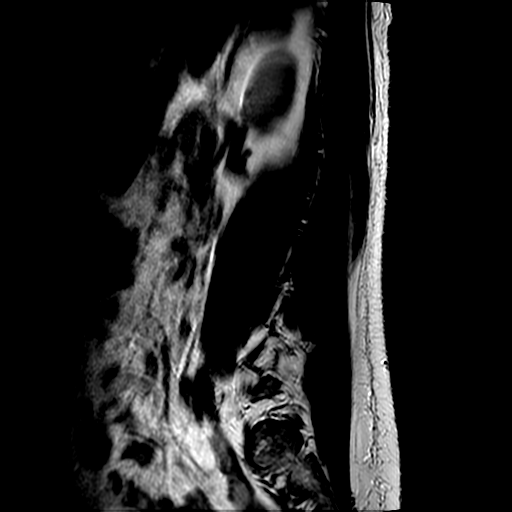
[im 3/14]
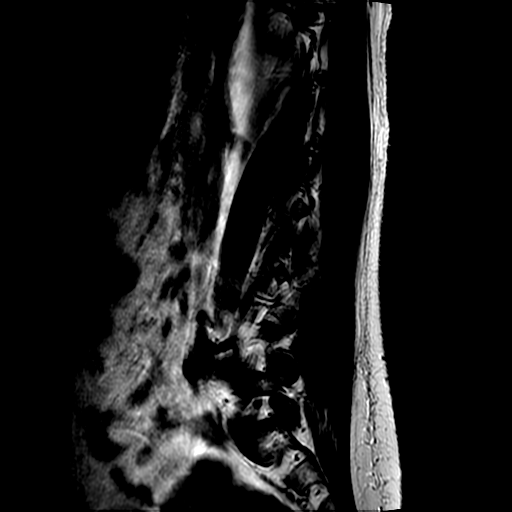
[im 6/14]
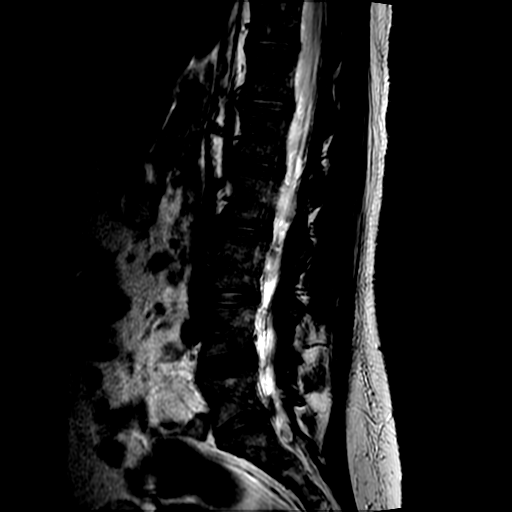
[im 8/14]
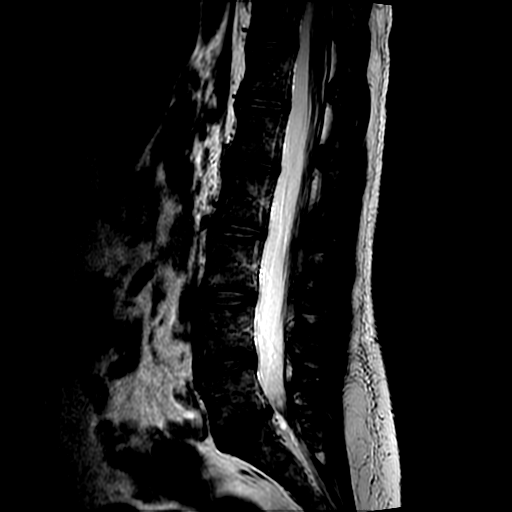
[im 11/14]
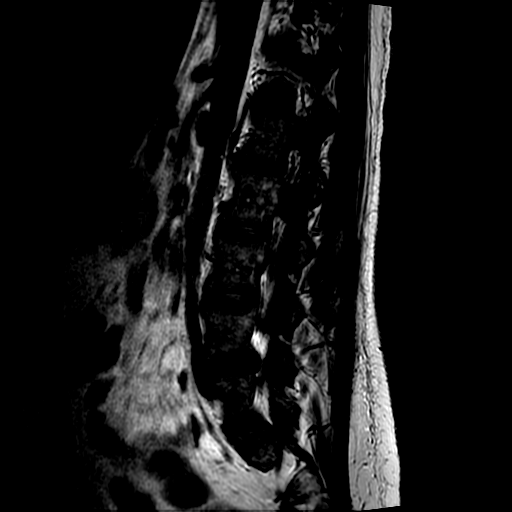
[im 14/14]
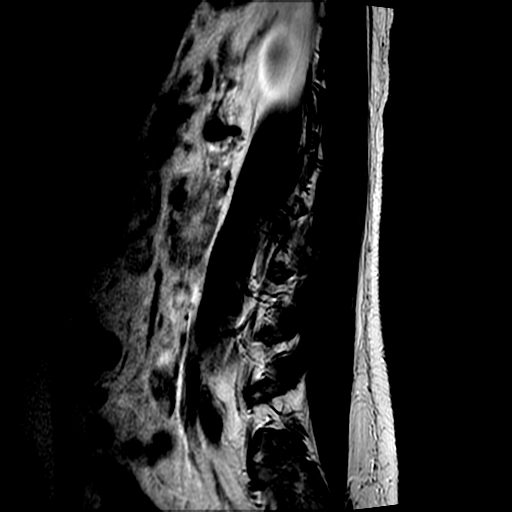

[Series 500: STIR · sagittal · 4.0mm · 0.55mm/px · 3 of 14 slices shown]
[im 1/14]
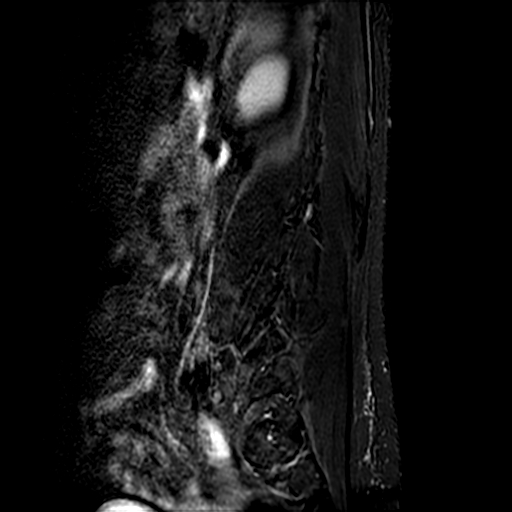
[im 7/14]
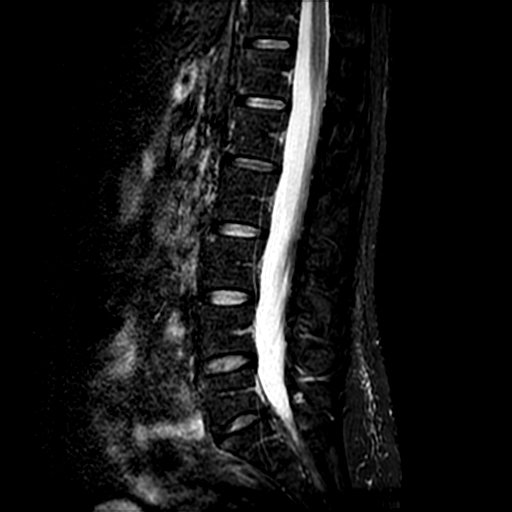
[im 14/14]
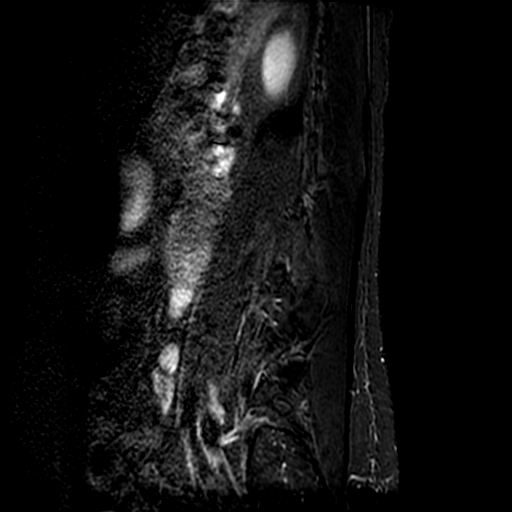

[Series 900: T2 · axial · 4.0mm · 0.39mm/px · z∈[-83,+96]mm · 7 of 42 slices shown (2 of 2)]
[im 3/42]
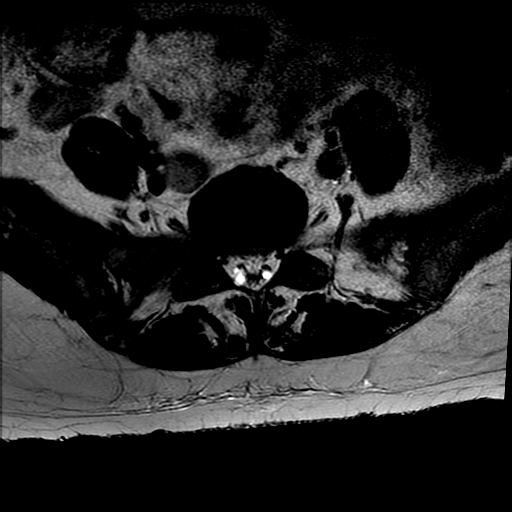
[im 6/42]
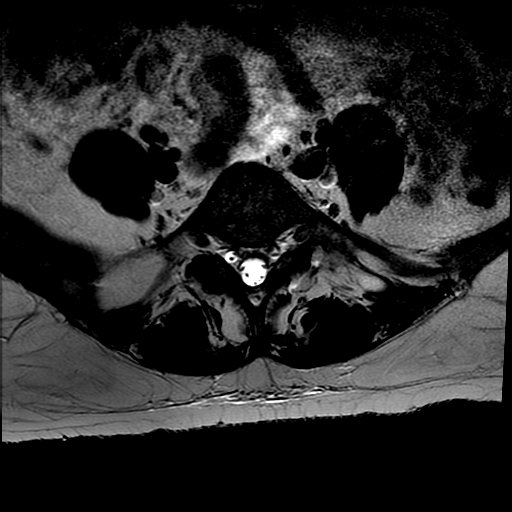
[im 9/42]
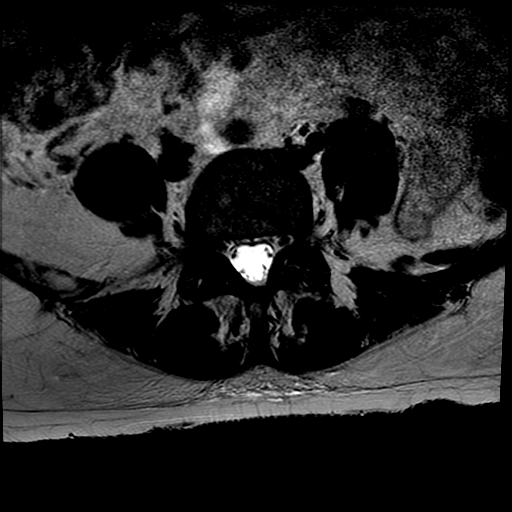
[im 14/42]
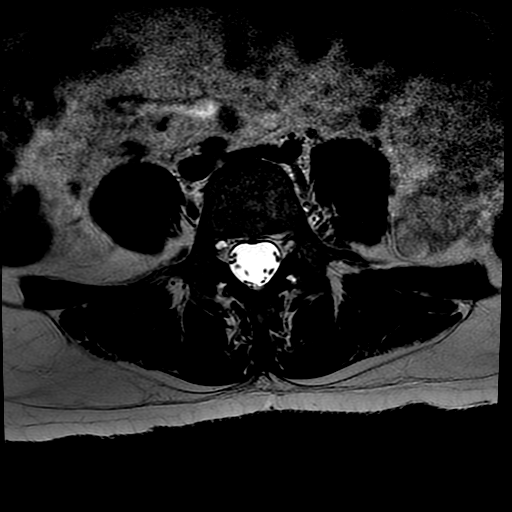
[im 20/42]
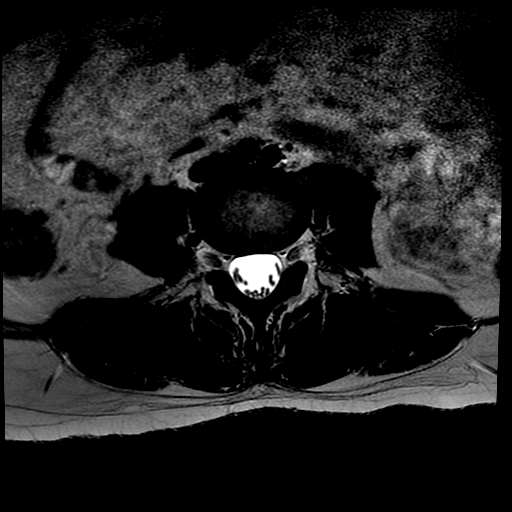
[im 22/42]
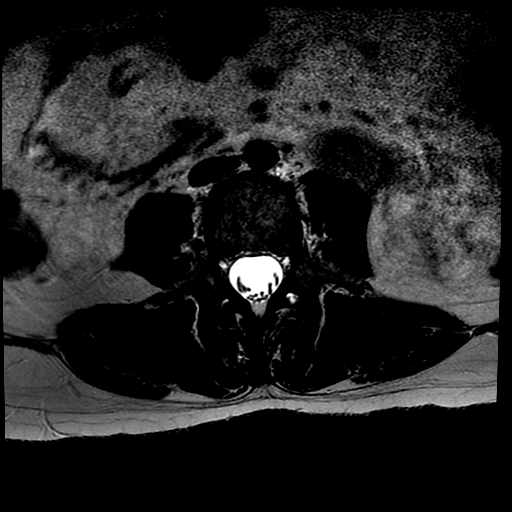
[im 36/42]
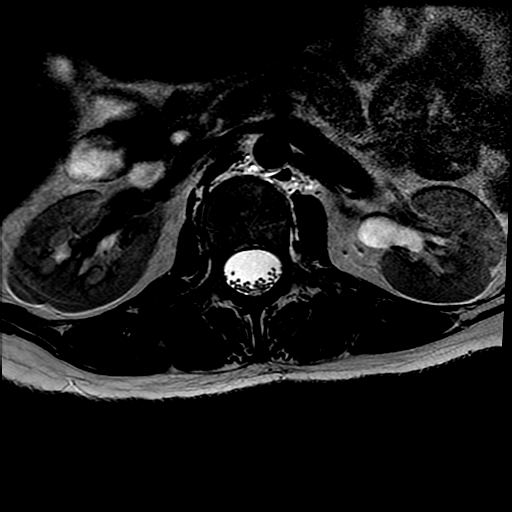

[Series 1100: T1 · axial · 4.0mm · 0.39mm/px · z∈[-70,+96]mm · 3 of 42 slices shown]
[im 6/42]
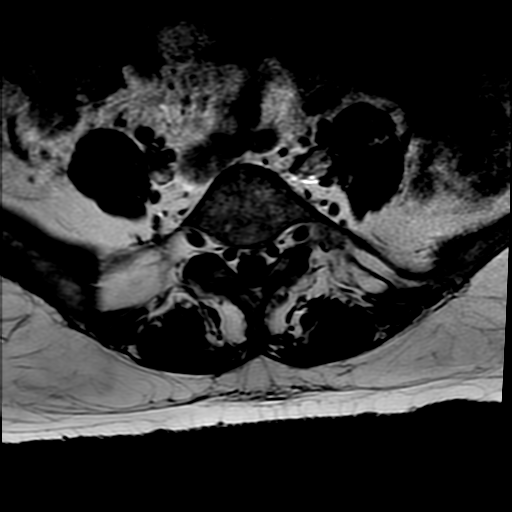
[im 22/42]
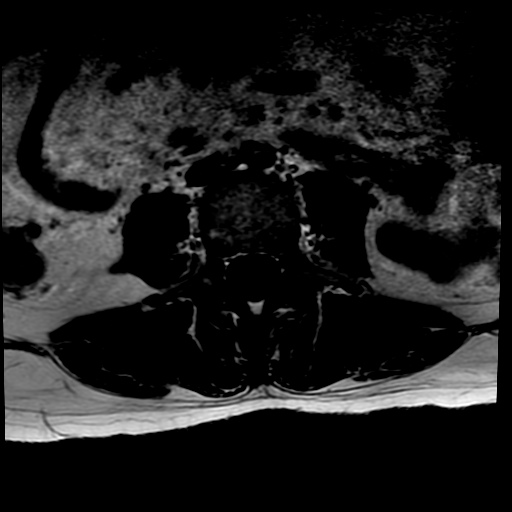
[im 36/42]
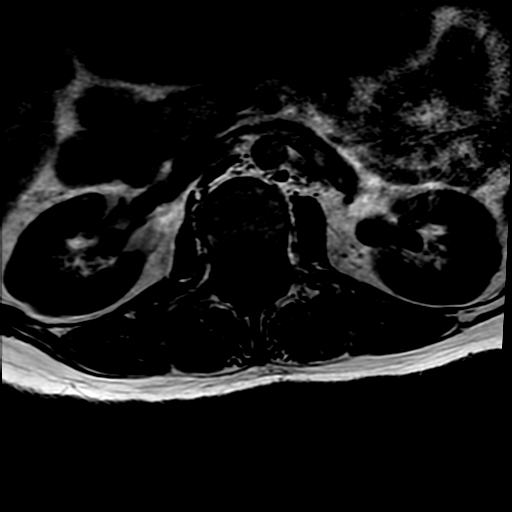

[19 of 48 positions shown; findings below may reference images not displayed]

FINDINGS: Patient motion degrades image quality.

The vertebral bodies of the lumbar spine are normal in size and
alignment. There is normal bone marrow signal demonstrated
throughout the vertebra. The intervertebral disc spaces are
well-maintained.

The spinal cord is normal in signal and contour. The cord terminates
normally at L1 . The nerve roots of the cauda equina and the filum
terminale are normal.

The visualized portions of the SI joints are unremarkable.

The imaged intra-abdominal contents are unremarkable.

T12-L1: No significant disc bulge. No evidence of neural foraminal
stenosis. No central canal stenosis.

L1-L2: No significant disc bulge. No evidence of neural foraminal
stenosis. No central canal stenosis.

L2-L3: No significant disc bulge. No evidence of neural foraminal
stenosis. No central canal stenosis.

L3-L4: No significant disc bulge. No evidence of neural foraminal
stenosis. No central canal stenosis.

L4-L5: No significant disc bulge. No evidence of neural foraminal
stenosis. No central canal stenosis. Mild bilateral facet
arthropathy.

L5-S1: No significant disc bulge. No evidence of neural foraminal
stenosis. No central canal stenosis.
IMPRESSION: 1. No significant lumbar spine disc protrusion or foraminal
stenosis.
2. Mild bilateral facet arthropathy at L4-5.

## 2016-07-02 ENCOUNTER — Emergency Department (HOSPITAL_BASED_OUTPATIENT_CLINIC_OR_DEPARTMENT_OTHER)
Admit: 2016-07-02 | Discharge: 2016-07-02 | Disposition: A | Discharge status: Home / Self Care | Payer: Medicaid Other | Attending: Student | Admitting: Student

## 2016-07-02 ENCOUNTER — Emergency Department (HOSPITAL_COMMUNITY): Payer: Medicaid Other

## 2016-07-02 ENCOUNTER — Emergency Department (HOSPITAL_COMMUNITY)
Admission: EM | Admit: 2016-07-02 | Discharge: 2016-07-02 | Disposition: A | Discharge status: Home / Self Care | Payer: Medicaid Other | Attending: Emergency Medicine | Admitting: Emergency Medicine

## 2016-07-02 ENCOUNTER — Encounter (HOSPITAL_COMMUNITY): Payer: Self-pay | Admitting: Family Medicine

## 2016-07-02 DIAGNOSIS — F17210 Nicotine dependence, cigarettes, uncomplicated: Secondary | ICD-10-CM | POA: Diagnosis not present

## 2016-07-02 DIAGNOSIS — R0789 Other chest pain: Secondary | ICD-10-CM | POA: Insufficient documentation

## 2016-07-02 DIAGNOSIS — M79661 Pain in right lower leg: Secondary | ICD-10-CM | POA: Diagnosis not present

## 2016-07-02 DIAGNOSIS — R079 Chest pain, unspecified: Secondary | ICD-10-CM

## 2016-07-02 HISTORY — DX: Personal history of pulmonary embolism: Z86.711

## 2016-07-02 LAB — BASIC METABOLIC PANEL
Anion gap: 9 (ref 5–15)
CO2: 22 mmol/L (ref 22–32)
CREATININE: 0.75 mg/dL (ref 0.44–1.00)
Calcium: 9.2 mg/dL (ref 8.9–10.3)
Chloride: 105 mmol/L (ref 101–111)
GFR calc Af Amer: >60 mL/min (ref >60)
GFR calc non Af Amer: >60 mL/min (ref >60)
Glucose, Bld: 130 mg/dL — ABNORMAL HIGH (ref 65–99)
Potassium: 3.8 mmol/L (ref 3.5–5.1)
Sodium: 136 mmol/L (ref 135–145)

## 2016-07-02 LAB — I-STAT TROPONIN, ED: Troponin i, poc: 0 ng/mL (ref 0.00–0.08)

## 2016-07-02 LAB — D-DIMER, QUANTITATIVE: D-Dimer, Quant: 0.68 ug/mL-FEU — ABNORMAL HIGH (ref 0.00–0.50)

## 2016-07-02 LAB — CBC
HCT: 42.9 % (ref 36.0–46.0)
Hemoglobin: 14.5 g/dL (ref 12.0–15.0)
MCH: 32.4 pg (ref 26.0–34.0)
MCHC: 33.8 g/dL (ref 30.0–36.0)
MCV: 96 fL (ref 78.0–100.0)
PLATELETS: 262 10*3/uL (ref 150–400)
RBC: 4.47 MIL/uL (ref 3.87–5.11)
RDW: 13.8 % (ref 11.5–15.5)
WBC: 9.9 10*3/uL (ref 4.0–10.5)

## 2016-07-02 MED ORDER — IBUPROFEN 800 MG PO TABS: 800.0000 mg | ORAL_TABLET | Freq: Three times a day (TID) | ORAL | 0 refills | Status: AC

## 2016-07-02 MED ORDER — IOPAMIDOL (ISOVUE-370) INJECTION 76%
INTRAVENOUS | Status: AC
  Administered 2016-07-02: 80 mL
  Filled 2016-07-02: qty 100

## 2016-07-02 MED ORDER — IBUPROFEN 800 MG PO TABS
800.0000 mg | ORAL_TABLET | Freq: Once | ORAL | Status: AC
  Administered 2016-07-02: 800 mg via ORAL
  Filled 2016-07-02: qty 1

## 2016-07-02 NOTE — ED Notes (Signed)
Pt verbalized understanding of d/c instructions and has no further questions. Pt stable and NAD.

## 2016-07-02 NOTE — ED Notes (Addendum)
While assessing pt, pt stated to this RN "Will you do me a favor and tell the Dr I change my mind about pain medicine and would like something else" Provider notified

## 2016-07-02 NOTE — Progress Notes (Signed)
**  Preliminary report by tech**  Right lower venous duplex completed. There is no evidence of deep or superficial vein thrombosis of the right leg. All visualized vessels appear patent and compressible. There is no evidence of a Baker's cyst on the right. Results were given to the patient's nurse, Lennette Bihari.  07/02/16 4:33 PM Jennifer Mendoza RVT

## 2016-07-02 NOTE — ED Triage Notes (Signed)
Pt here for right chest/rib pain that started this am after waking up. sts she cant get a deep breath. sts hx of PE.

## 2016-07-02 NOTE — ED Notes (Signed)
Vascular Called and stated that Venous study was negative.

## 2016-07-02 NOTE — ED Notes (Signed)
Discharged vitals taken.  IV removed and pt is getting dressed.  Will obtain another b/p before pt leaves.

## 2016-07-02 NOTE — Discharge Instructions (Signed)
Take ibuprofen for pain. If symptoms worsen or fail to improve return to ED.

## 2016-07-02 NOTE — ED Provider Notes (Signed)
Jennifer Mendoza Provider Note   CSN: [id number] Arrival date & time: 07/02/16  1331  First Provider Contact:  First MD Initiated Contact with Patient 07/02/16 1448        History   Chief Complaint Chief Complaint  Patient presents with  . Chest Pain    HPI Jennifer Mendoza is a 40 y.o. female.  40 yo Caucasian female with history of PE and opoid abuse that presents today with right sided chest pain and sob. Patient states she woke up around 0400AM with sharp pain located on the right side of her chest that is constant. It radiates to her neck and over the entire right chest wall. She is unable to take a deep breath due to the pain. Pain is not exertional in nature. She denies any fever chills, cough, recent URI, nausea, vomiting, abd pain. She has tried ibuprofen without relief. Moving makes it worse.  Patient has hx of PE approx 2 years ago that was treated with Xarelto. She is not currently anticoagulated. She endorses right ankle swelling that started on Monday. She associated it with exercise. She traveled to [facility] last week but not prolonged car rides. Denies any hormone therapy, recent surgeries, hospitalizations. Patient does smoke tobacco.   Patient is currently on suboxone due to opoid abuse in the past.   The history is provided by the patient.  Chest Pain  Associated symptoms include shortness of breath (Limited by pain). Pertinent negatives include no abdominal pain, no back pain, no cough, no fever, no headaches, no nausea, no numbness, no palpitations, no vomiting and no weakness.    Past Medical History:  Diagnosis Date  . Agoraphobia with panic attacks   . Anxiety   . Depression   . GERD (gastroesophageal reflux disease)   . History of IBS   . History of pulmonary embolus (PE)   . Migraine headache without aura   . PANIC ATTACKS 01/26/2007  . Tobacco user     Patient Active Problem List   Diagnosis Date Noted  . Chest pain 06/03/2014  . Pulmonary  embolism, bilateral ([place]) 01/26/2014  . Lumbar strain 10/20/2011  . Dysuria 09/23/2011  . Sinus pain 09/06/2011  . Knee pain, right 02/15/2011  . 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 BACK PAIN, CHRONIC, INTERMITTENT 11/11/2008  . BENIGN NEOPLASM OF VAGINA 03/05/2008  . ANXIETY 01/26/2007  . TOBACCO DEPENDENCE 01/26/2007  . MIGRAINE, UNSPEC., W/O INTRACTABLE MIGRAINE 01/26/2007  . 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 CAROTID STENT  10/1998   For kidney stones    OB History    No data available       Home Medications    Prior to Admission medications   Medication Sig Start Date End Date Taking? Authorizing Provider  alprazolam Duanne Moron) 2 MG tablet Take 2 mg by mouth 4 (four) times daily as needed for anxiety.  01/23/15   Historical Provider, MD  Biotin 5000 MCG TABS Take 5,000 mg by mouth daily.     Historical Provider, MD  Coconut Oil OIL Take 15 mLs by mouth daily.     Historical Provider, MD  ibuprofen (ADVIL,MOTRIN) 800 MG tablet Take 1 tablet (800 mg total) by mouth every 8 (eight) hours as needed for mild pain. 01/30/15   Kristen N Ward, DO  oxyCODONE-acetaminophen (PERCOCET/ROXICET) 5-325 MG per tablet Take 1 tablet by mouth every 4 (four) hours as needed. 01/30/15   [place], DO    Family  History Family History  Problem Relation Age of Onset  . Osteoporosis Mother   . Cancer Father   . Hypertension Father     Social History Social History  Substance Use Topics  . Smoking status: Current Every Day Smoker    Packs/day: 0.50    Types: Cigarettes  . Smokeless tobacco: Never Used     Comment: Only 1-2 cigarettes since PE in March 2015  . Alcohol use No     Allergies   Review of patient's allergies indicates no known allergies.   Review of Systems Review of Systems  Constitutional: Negative for chills and fever.  HENT: Negative for ear pain and sore throat.   Eyes: Negative for pain and visual disturbance.    Respiratory: Positive for shortness of breath (Limited by pain). Negative for cough.   Cardiovascular: Positive for chest pain and leg swelling (ankle). Negative for palpitations.  Gastrointestinal: Negative for abdominal pain, diarrhea, nausea and vomiting.  Genitourinary: Negative for dysuria and hematuria.  Musculoskeletal: Negative for arthralgias and back pain.  Skin: Negative for color change and rash.  Neurological: Negative for syncope, weakness, numbness and headaches.  Hematological: Does not bruise/bleed easily.  All other systems reviewed and are negative.    Physical Exam Updated Vital Signs BP 128/86 (BP Location: Left Arm)   Pulse (!) 125   Temp 98.3 F (36.8 C) (Oral)   Resp 24   Ht 5' 6"  (1.676 m)   Wt 72.6 kg   LMP 06/27/2016 (Exact Date)   SpO2 100%   BMI 25.82 kg/m   Physical Exam  Constitutional: She appears well-developed and well-nourished. No distress.  HENT:  Head: Normocephalic and atraumatic.  Eyes: Pupils are equal, round, and reactive to light.  Neck: Normal range of motion. Neck supple. No JVD present.  Cardiovascular: Normal rate, regular rhythm, normal heart sounds and intact distal pulses.   Pulmonary/Chest: Effort normal and breath sounds normal. She has no decreased breath sounds. She has no wheezes. She has no rhonchi. She has no rales. She exhibits no tenderness.  Unable to take a breath due to pain  Abdominal: Soft. Bowel sounds are normal. There is no tenderness.  Musculoskeletal: Normal range of motion. She exhibits no edema or tenderness.  No leg swelling or tenderness to palpation.   Neurological: She is alert.  Skin: Skin is warm and dry. Capillary refill takes less than 2 seconds.     ED Treatments / Results  Labs (all labs ordered are listed, but only abnormal results are displayed) Labs Reviewed  BASIC METABOLIC PANEL - Abnormal; Notable for the following:       Result Value   Glucose, Bld 130 (*)    BUN <5 (*)    All  other components within normal limits  D-DIMER, QUANTITATIVE (NOT AT [facility]) - Abnormal; Notable for the following:    D-Dimer, Quant 0.68 (*)    All other components within normal limits  CBC  I-STAT TROPOININ, ED    EKG  EKG Interpretation  Date/Time:  Friday July 02 2016 14:34:46 EDT Ventricular Rate:  72 PR Interval:    QRS Duration: 86 QT Interval:  381 QTC Calculation: 417 R Axis:   44 Text Interpretation:  Sinus rhythm Since last tracing rate slower Confirmed by MILLER  MD, BRIAN ([id number]) on 07/02/2016 2:49:03 PM       Radiology Dg Chest 2 View  Result Date: 07/02/2016 CLINICAL DATA:  Severe right-sided chest pain. Pain woke her up at 4 a.m.  Difficulty breathing. Swelling in the right leg and foot. Personal history pulmonary embolus. EXAM: CHEST  2 VIEW COMPARISON:  None. FINDINGS: The heart size normal. Bibasilar airspace disease likely reflects atelectasis. The upper lung fields are clear. There is no edema or effusion. The visualized soft tissues and bony thorax are unremarkable. IMPRESSION: 1. Bibasilar airspace disease likely reflects atelectasis or scarring. 2. No other acute cardiopulmonary disease. Electronically Signed   By: San Morelle M.D.   On: 07/02/2016 14:25   Ct Angio Chest Pe W/cm &/or Wo Cm  Result Date: 07/02/2016 CLINICAL DATA:  Chest pain and shortness of breath since this morning. History of pulmonary embolus. EXAM: CT ANGIOGRAPHY CHEST WITH CONTRAST TECHNIQUE: Multidetector CT imaging of the chest was performed using the standard protocol during bolus administration of intravenous contrast. Multiplanar CT image reconstructions and MIPs were obtained to evaluate the vascular anatomy. CONTRAST:  80 cc Isovue 370 COMPARISON:  Chest CT angiogram dated 05/23/2015. FINDINGS: Mediastinum/Lymph Nodes: No pulmonary embolism identified within the main, lobar or segmental pulmonary arteries bilaterally. Thoracic aorta is normal in caliber and configuration. No  aortic aneurysm or dissection. Heart size is upper normal, stable. No mass or enlarged lymph nodes within the mediastinum or perihilar regions. Esophagus is unremarkable. Cystic-appearing mass within the left thyroid lobe is stable dating back to 01/26/2014 indicating benignity. Lungs/Pleura: Small patchy consolidations at each lung base are most likely atelectasis. Mild emphysematous change bilaterally, upper lobe predominant. No pleural effusion or pneumothorax seen. Upper abdomen: Limited images of the upper abdomen are unremarkable. Musculoskeletal: No acute or suspicious osseous finding. Superficial soft tissues are unremarkable. Review of the MIP images confirms the above findings. IMPRESSION: 1. No pulmonary embolism. 2. No aortic aneurysm or dissection. 3. Heart size is upper normal.  No pericardial effusion. 4. Small patchy consolidations at each lung base are most likely atelectasis. Pneumonia is considered less likely unless febrile. 5. Mild emphysematous change, upper lobe predominant. Electronically Signed   By: Franki Cabot M.D.   On: 07/02/2016 16:18    Procedures Procedures (including critical care time)  Medications Ordered in ED Medications  ibuprofen (ADVIL,MOTRIN) tablet 800 mg (800 mg Oral Given 07/02/16 1519)  iopamidol (ISOVUE-370) 76 % injection (80 mLs  Contrast Given 07/02/16 1555)     Initial Impression / Assessment and Plan / ED Course  I have reviewed the triage vital signs and the nursing notes.  Pertinent labs & imaging results that were available during my care of the patient were reviewed by me and considered in my medical decision making (see chart for details).  Clinical Course  Comment By Time  40 year old female, presents with acute onset of right sided sharp and stabbing pain that occurs when she takes a deep breath, started at 4:00 this morning, it has been persistent throughout the day. She does not have exertional symptoms, she does have a history of blood  clots in the past but is not on anticoagulations at this time. On exam the patient has no tenderness over that area, she has clear lung sounds, she is unable to take a deep breath because of pain. No swelling in the legs, no JVD, soft nontender abdomen. Lung sounds are clear, she has not been coughing or having fevers. Labs unremarkable, d-dimer is barely elevated but thankfully the CT angiogram and the ultrasound of the legs show no signs of blood clot. The patient will be treated with anti-inflammatories and to follow-up in the outpatient setting. Doubt acute coronary syndrome. Noemi Chapel,  MD 08/04 1644  1448: Patient seen and evaluated. Ordered imaging and pain medication.   4:05 PM Patient asking for pain medicine. She states the ibuprofen is not helping. Dr. Sabra Heck discussed with patient policy with narcotics and opioid abuse.  1630: Reviewed imaging results. Discussed plan of care with Dr. Sabra Heck. D/C with ibuprofen.  MDM: Patient with prior history of PE. Wells score 3. D-dimer slightly elevated. Imaging indicated. CT without evidence of PE. Doppler study of leg without evidence of DVT. Ekg and labs including troponin  unremarkable. Low suspicion for ACS. Will treat patient with NSAIDs. Dicussed plan with Dr. Sabra Heck. Patient verbalized understanding plan of care. Will need to follow up with PCP.  Final Clinical Impressions(s) / ED Diagnoses   Final diagnoses:  Chest pain, unspecified chest pain type    New Prescriptions New Prescriptions   No medications on file     Doristine Devoid, PA-C 07/02/16 1737    Noemi Chapel, MD 07/03/16 [phone]

## 2016-10-13 ENCOUNTER — Emergency Department (HOSPITAL_BASED_OUTPATIENT_CLINIC_OR_DEPARTMENT_OTHER): Payer: Medicaid Other

## 2016-10-13 ENCOUNTER — Observation Stay (HOSPITAL_BASED_OUTPATIENT_CLINIC_OR_DEPARTMENT_OTHER)
Admission: EM | Admit: 2016-10-13 | Discharge: 2016-10-14 | Disposition: A | Discharge status: Home / Self Care | Payer: Medicaid Other | Attending: Internal Medicine | Admitting: Internal Medicine

## 2016-10-13 ENCOUNTER — Encounter (HOSPITAL_BASED_OUTPATIENT_CLINIC_OR_DEPARTMENT_OTHER): Payer: Self-pay | Admitting: *Deleted

## 2016-10-13 DIAGNOSIS — Z86711 Personal history of pulmonary embolism: Secondary | ICD-10-CM | POA: Insufficient documentation

## 2016-10-13 DIAGNOSIS — M79604 Pain in right leg: Secondary | ICD-10-CM

## 2016-10-13 DIAGNOSIS — I2699 Other pulmonary embolism without acute cor pulmonale: Secondary | ICD-10-CM | POA: Diagnosis present

## 2016-10-13 DIAGNOSIS — F17200 Nicotine dependence, unspecified, uncomplicated: Secondary | ICD-10-CM | POA: Diagnosis present

## 2016-10-13 DIAGNOSIS — F17210 Nicotine dependence, cigarettes, uncomplicated: Secondary | ICD-10-CM | POA: Diagnosis not present

## 2016-10-13 DIAGNOSIS — R1031 Right lower quadrant pain: Secondary | ICD-10-CM | POA: Diagnosis not present

## 2016-10-13 DIAGNOSIS — F419 Anxiety disorder, unspecified: Secondary | ICD-10-CM | POA: Insufficient documentation

## 2016-10-13 DIAGNOSIS — R1032 Left lower quadrant pain: Secondary | ICD-10-CM | POA: Diagnosis not present

## 2016-10-13 DIAGNOSIS — K219 Gastro-esophageal reflux disease without esophagitis: Secondary | ICD-10-CM | POA: Diagnosis present

## 2016-10-13 DIAGNOSIS — K5900 Constipation, unspecified: Secondary | ICD-10-CM | POA: Diagnosis not present

## 2016-10-13 DIAGNOSIS — R109 Unspecified abdominal pain: Secondary | ICD-10-CM | POA: Diagnosis present

## 2016-10-13 DIAGNOSIS — F411 Generalized anxiety disorder: Secondary | ICD-10-CM | POA: Diagnosis present

## 2016-10-13 LAB — CBC
HCT: 38.9 % (ref 36.0–46.0)
Hemoglobin: 12.8 g/dL (ref 12.0–15.0)
MCH: 31.1 pg (ref 26.0–34.0)
MCHC: 32.9 g/dL (ref 30.0–36.0)
MCV: 94.6 fL (ref 78.0–100.0)
PLATELETS: 289 10*3/uL (ref 150–400)
RBC: 4.11 MIL/uL (ref 3.87–5.11)
RDW: 13.2 % (ref 11.5–15.5)
WBC: 6.9 10*3/uL (ref 4.0–10.5)

## 2016-10-13 LAB — TROPONIN I

## 2016-10-13 LAB — BASIC METABOLIC PANEL
Anion gap: 8 (ref 5–15)
BUN: 7 mg/dL (ref 6–20)
CALCIUM: 9.2 mg/dL (ref 8.9–10.3)
CO2: 27 mmol/L (ref 22–32)
CREATININE: 0.61 mg/dL (ref 0.44–1.00)
Chloride: 102 mmol/L (ref 101–111)
GFR calc Af Amer: >60 mL/min (ref >60)
Glucose, Bld: 86 mg/dL (ref 65–99)
Potassium: 3.9 mmol/L (ref 3.5–5.1)
SODIUM: 137 mmol/L (ref 135–145)

## 2016-10-13 LAB — PREGNANCY, URINE: PREG TEST UR: NEGATIVE

## 2016-10-13 MED ORDER — MORPHINE SULFATE (PF) 4 MG/ML IV SOLN
4.0000 mg | Freq: Once | INTRAVENOUS | Status: AC
  Administered 2016-10-13: 4 mg via INTRAVENOUS
  Filled 2016-10-13: qty 1

## 2016-10-13 MED ORDER — ALBUTEROL SULFATE (2.5 MG/3ML) 0.083% IN NEBU: 5.0000 mg | INHALATION_SOLUTION | Freq: Once | RESPIRATORY_TRACT | Status: DC

## 2016-10-13 MED ORDER — NICOTINE 21 MG/24HR TD PT24
21.0000 mg | MEDICATED_PATCH | Freq: Once | TRANSDERMAL | Status: AC
  Administered 2016-10-13: 21 mg via TRANSDERMAL
  Filled 2016-10-13: qty 1

## 2016-10-13 MED ORDER — IOPAMIDOL (ISOVUE-370) INJECTION 76%
100.0000 mL | Freq: Once | INTRAVENOUS | Status: AC | PRN
  Administered 2016-10-13: 100 mL via INTRAVENOUS

## 2016-10-13 MED ORDER — HEPARIN (PORCINE) IN NACL 100-0.45 UNIT/ML-% IJ SOLN
1200.0000 [IU]/h | INTRAMUSCULAR | Status: DC
  Administered 2016-10-13: 1200 [IU]/h via INTRAVENOUS
  Filled 2016-10-13: qty 250

## 2016-10-13 NOTE — ED Notes (Signed)
Dose verified by 2nd RN. Rod Holler, RN

## 2016-10-13 NOTE — Progress Notes (Signed)
ANTICOAGULATION CONSULT NOTE - Initial Consult  Pharmacy Consult for heparin Indication: PE  No Known Allergies  Patient Measurements: Height: 5' 6"  (167.6 cm) Weight: 155 lb (70.3 kg) IBW/kg (Calculated) : 59.3 Heparin Dosing Weight: 70.3 kg  Vital Signs: Temp: 97.9 F (36.6 C) (11/15 1802) BP: 110/56 (11/15 2027) Pulse Rate: 66 (11/15 2027)  Labs:  Recent Labs  10/13/16 1850  HGB 12.8  HCT 38.9  PLT 289  CREATININE 0.61  TROPONINI <0.03    Estimated Creatinine Clearance: 87.5 mL/min (by C-G formula based on SCr of 0.61 mg/dL).   Medical History: Past Medical History:  Diagnosis Date  . Agoraphobia with panic attacks   . Anxiety   . Depression   . GERD (gastroesophageal reflux disease)   . History of IBS   . History of pulmonary embolus (PE)   . Migraine headache without aura   . PANIC ATTACKS 01/26/2007  . Tobacco user     Assessment: 40 yo F presents on 11/15 with SOB. CT showed PE. Patient has a history of PE and is on Xarelto. Heparin level most likely will be falsely elevated. Last dose per patient was today. CBC stable. Has had blood tinged sputum for the past 2 days. Pharmacy consulted to start heparin gtt.  Goal of Therapy:  Heparin level 0.3-0.7 units/ml Monitor platelets by anticoagulation protocol: Yes   Plan:  No heparin bolus due to Xarelto Start heparin gtt at 1,200 units/hr Check 6 hr aPTT / HL Monitor daily HL / aPTT, CBC, s/s of bleed  Elenor Quinones, PharmD, BCPS Clinical Pharmacist Pager [phone] 10/13/2016 9:22 PM

## 2016-10-13 NOTE — ED Notes (Signed)
Pt states she took a xarelto 10 mg today that was left over from last time

## 2016-10-13 NOTE — ED Provider Notes (Signed)
[facility] Provider Note   CSN: [id number] Arrival date & time: 10/13/16  1755   By signing my name below, I, Neta Mends, attest that this documentation has been prepared under the direction and in the presence of Quincy Carnes, PA-C. Electronically Signed: Neta Mends, ED Scribe. 10/13/2016. 6:37 PM.   History   Chief Complaint Chief Complaint  Patient presents with  . Shortness of Breath   The history is provided by the patient. No language interpreter was used.   HPI Comments:  Jennifer Mendoza is a 40 y.o. female with PMHx of PE who presents to the Emergency Department complaining of constant SOB x 2 days. Pt complains of associated cough with bloody sputum, nausea, constipation, and swelling to her right foot and ankle. Pt report that she is having a stabbing pain on the left side of her chest that is exacerbated when laying down and when coughing. Pt notes that she has been unable to have a BM due to pain. Pt notes that she does not eat a lot of food during the day. Pt is a smoker and has been smoking half a pack a day since her PE in 2015. No alleviating factors noted. Pt denies injury/trauma. Pt denies vomiting.  States she is not sure why she for a clot the last time, no one could ever tell her why. She reports her brother does have history of DVTs as well.  Past Medical History:  Diagnosis Date  . Agoraphobia with panic attacks   . Anxiety   . Depression   . GERD (gastroesophageal reflux disease)   . History of IBS   . History of pulmonary embolus (PE)   . Migraine headache without aura   . PANIC ATTACKS 01/26/2007  . Tobacco user     Patient Active Problem List   Diagnosis Date Noted  . Chest pain 06/03/2014  . Pulmonary embolism, bilateral ([place]) 01/26/2014  . Lumbar strain 10/20/2011  . Dysuria 09/23/2011  . Sinus pain 09/06/2011  . Knee pain, right 02/15/2011  . 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 BACK PAIN, CHRONIC, INTERMITTENT 11/11/2008  .  BENIGN NEOPLASM OF VAGINA 03/05/2008  . ANXIETY 01/26/2007  . TOBACCO DEPENDENCE 01/26/2007  . MIGRAINE, UNSPEC., W/O INTRACTABLE MIGRAINE 01/26/2007  . 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 CAROTID STENT  10/1998   For kidney stones    OB History    No data available       Home Medications    Prior to Admission medications   Medication Sig Start Date End Date Taking? Authorizing Provider  Rivaroxaban (XARELTO PO) Take by mouth.   Yes Historical Provider, MD  Biotin 5000 MCG TABS Take 5,000 mg by mouth daily.     Historical Provider, MD  Coconut Oil OIL Take 15 mLs by mouth daily.     Historical Provider, MD  ibuprofen (ADVIL,MOTRIN) 800 MG tablet Take 1 tablet (800 mg total) by mouth 3 (three) times daily. 07/02/16   Doristine Devoid, PA-C    Family History Family History  Problem Relation Age of Onset  . Osteoporosis Mother   . Cancer Father   . Hypertension Father     Social History Social History  Substance Use Topics  . Smoking status: Current Every Day Smoker    Packs/day: 0.50    Types: Cigarettes  . Smokeless tobacco: Never Used     Comment: Only 1-2 cigarettes since PE in March 2015  .  Alcohol use No     Allergies   Patient has no known allergies.   Review of Systems Review of Systems  Respiratory: Positive for cough, chest tightness and shortness of breath.   Cardiovascular: Positive for leg swelling.  Gastrointestinal: Positive for constipation and nausea. Negative for vomiting.  All other systems reviewed and are negative.    Physical Exam Updated Vital Signs BP 137/81   Pulse 94   Temp 97.9 F (36.6 C)   Resp 18   Ht 5' 6"  (1.676 m)   Wt 155 lb (70.3 kg)   LMP 09/29/2016   SpO2 98%   BMI 25.02 kg/m   Physical Exam  Constitutional: She is oriented to person, place, and time. She appears well-developed and well-nourished.  HENT:  Head: Normocephalic and atraumatic.  Mouth/Throat:  Oropharynx is clear and moist.  Eyes: Conjunctivae and EOM are normal. Pupils are equal, round, and reactive to light.  Neck: Normal range of motion.  Cardiovascular: Normal rate, regular rhythm and normal heart sounds.   Pulmonary/Chest: Effort normal and breath sounds normal. No respiratory distress. She has no wheezes. She has no rhonchi.  Reports pain in the left lower ribs, no deformity noted  Abdominal: Soft. Bowel sounds are normal. She exhibits no mass. There is no tenderness.  Musculoskeletal: Normal range of motion.  Right calf mildly tender to palpation, no significant swelling or erythema noted; DP pulse intact  Neurological: She is alert and oriented to person, place, and time.  Skin: Skin is warm and dry.  Psychiatric: She has a normal mood and affect.  Nursing note and vitals reviewed.    ED Treatments / Results  DIAGNOSTIC STUDIES:  Oxygen Saturation is 98% on RA, normal by my interpretation.    COORDINATION OF CARE:  6:37 PM Discussed treatment plan with pt at bedside and pt agreed to plan.   Labs (all labs ordered are listed, but only abnormal results are displayed) Labs Reviewed  BASIC METABOLIC PANEL  CBC  TROPONIN I  PREGNANCY, URINE  APTT  HEPARIN LEVEL (UNFRACTIONATED)  CBC    EKG  EKG Interpretation None       Radiology Ct Angio Chest Pe W And/or Wo Contrast  Result Date: 10/13/2016 CLINICAL DATA:  Left chest pain and shortness of breath and nausea for 2 days. Previous history of pulmonary embolism. EXAM: CT ANGIOGRAPHY CHEST WITH CONTRAST TECHNIQUE: Multidetector CT imaging of the chest was performed using the standard protocol during bolus administration of intravenous contrast. Multiplanar CT image reconstructions and MIPs were obtained to evaluate the vascular anatomy. CONTRAST:  100 mL Isovue 370 COMPARISON:  07/02/2016 FINDINGS: Cardiovascular: Satisfactory opacification of pulmonary arteries noted. A single site of pulmonary embolism is  seen within a segmental branch of the lingular pulmonary artery. No other definite sites of pulmonary embolism identified. No evidence of thoracic aortic aneurysm or dissection. No evidence of right heart strain. Mediastinum/Nodes: No pathologically enlarged lymph nodes identified. 2 cm left thyroid lobe nodule shows no significant change since 2015. Lungs/Pleura: Peripheral pulmonary infiltrate is seen in the anterior aspect of the lingula, suspicious for pulmonary infarct. Previously seen patchy infiltrate in right lower lobe has improved, with residual linear opacity consistent with scarring. Scarring is also noted in the left lung base, with a focal area of airspace opacity in the posterior left lower lobe, which may be due to pneumonia or infarct, although no definite pulmonary emboli are seen within the left lower lobe pulmonary arteries. Mild emphysema again noted. Upper  abdomen: No acute findings. Musculoskeletal: No suspicious bone lesions or other significant abnormality identified. IMPRESSION: Single site of pulmonary embolism identified within segmental branch of lingular pulmonary artery. No other definite sites of pulmonary embolism identified. No evidence of right heart strain. Peripheral pulmonary infiltrate in anterior lingula is suspicious for pulmonary infarct. Focal airspace disease also noted in the posterior left lower lobe, which could be due to pneumonia or infarct, although no definite pulmonary emboli seen in this location. Mild emphysema. Stable 2 cm left thyroid lobe nodule. Critical Value/emergent results were called by telephone at the time of interpretation on 10/13/2016 at 8:36 pm to Dr. Thomasene Lot, who verbally acknowledged these results. Electronically Signed   By: Earle Gell M.D.   On: 10/13/2016 20:36   US Venous Img Lower Unilateral Right  Result Date: 10/13/2016 CLINICAL DATA:  Chronic right intermittent calf swelling and shortness of breath. Initial encounter. EXAM: RIGHT  LOWER EXTREMITY VENOUS DOPPLER ULTRASOUND TECHNIQUE: Gray-scale sonography with graded compression, as well as color Doppler and duplex ultrasound were performed to evaluate the lower extremity deep venous systems from the level of the common femoral vein and including the common femoral, femoral, profunda femoral, popliteal and calf veins including the posterior tibial, peroneal and gastrocnemius veins when visible. The superficial great saphenous vein was also interrogated. Spectral Doppler was utilized to evaluate flow at rest and with distal augmentation maneuvers in the common femoral, femoral and popliteal veins. COMPARISON:  None. FINDINGS: Contralateral Common Femoral Vein: Respiratory phasicity is normal and symmetric with the symptomatic side. No evidence of thrombus. Normal compressibility. Common Femoral Vein: No evidence of thrombus. Normal compressibility, respiratory phasicity and response to augmentation. Saphenofemoral Junction: No evidence of thrombus. Normal compressibility and flow on color Doppler imaging. Profunda Femoral Vein: No evidence of thrombus. Normal compressibility and flow on color Doppler imaging. Femoral Vein: No evidence of thrombus. Normal compressibility, respiratory phasicity and response to augmentation. Popliteal Vein: No evidence of thrombus. Normal compressibility, respiratory phasicity and response to augmentation. Calf Veins: No evidence of thrombus. Normal compressibility and flow on color Doppler imaging. Superficial Great Saphenous Vein: No evidence of thrombus. Normal compressibility and flow on color Doppler imaging. Venous Reflux:  None. Other Findings:  None. IMPRESSION: No evidence of deep venous thrombosis. Electronically Signed   By: Garald Balding M.D.   On: 10/13/2016 21:53    Procedures Procedures (including critical care time)  CRITICAL CARE Performed by: Larene Pickett   Total critical care time: 40 minutes  Critical care time was exclusive of  separately billable procedures and treating other patients.  Critical care was necessary to treat or prevent imminent or life-threatening deterioration.  Critical care was time spent personally by me on the following activities: development of treatment plan with patient and/or surrogate as well as nursing, discussions with consultants, evaluation of patient's response to treatment, examination of patient, obtaining history from patient or surrogate, ordering and performing treatments and interventions, ordering and review of laboratory studies, ordering and review of radiographic studies, pulse oximetry and re-evaluation of patient's condition.  Medications Ordered in ED Medications  nicotine (NICODERM CQ - dosed in mg/24 hours) patch 21 mg (21 mg Transdermal Patch Applied 10/13/16 2119)  heparin ADULT infusion 100 units/mL (25000 units/25m sodium chloride 0.45%) (1,200 Units/hr Intravenous Transfusing/Transfer 10/13/16 2316)  iopamidol (ISOVUE-370) 76 % injection 100 mL (100 mLs Intravenous Contrast Given 10/13/16 1959)  morphine 4 MG/ML injection 4 mg (4 mg Intravenous Given 10/13/16 2115)     Initial Impression /  Assessment and Plan / ED Course  I have reviewed the triage vital signs and the nursing notes.  Pertinent labs & imaging results that were available during my care of the patient were reviewed by me and considered in my medical decision making (see chart for details).  Clinical Course    40 year old female here with sided chest pain, shortness of breath, hemoptysis, and leg swelling. History of PE in the past, took herself off her anticoagulants. She is afebrile and nontoxic. Mild tenderness of the right calf without significant asymmetry or overlying skin changes. Given patient's history of PE without completing proper course of anticoagulation, will obtain labs, troponin, he is ultrasound of the right leg as well as CTA of the chest.  Labwork is reassuring. Venous ultrasound  negative for DVT. CTA is positive for PE in segmental branch of the lingular artery with questionable pulmonary infarct.  No evidence of right heart strain, not a code PE.  Given questionable infarct, patient will require admission.  IV heparin started.  Patient's PE's have both occurred without evidence of DVT or provoking event.  Question hereditary clotting disorder as patient's brother has history of clots as well. May need further workup for this. Case discussed with hospitalist, Dr. Hal Hope-- will admit to tele obs.  Temp admit orders placed.  Patient remains stable for transfer.  Final Clinical Impressions(s) / ED Diagnoses   Final diagnoses:  Right leg pain  Other acute pulmonary embolism without acute cor pulmonale (HCC)  Pulmonary infarct [facility])    New Prescriptions New Prescriptions   No medications on file   I personally performed the services described in this documentation, which was scribed in my presence. The recorded information has been reviewed and is accurate.    Larene Pickett, PA-C 10/13/16 [place], MD 10/15/16 1355

## 2016-10-13 NOTE — ED Triage Notes (Signed)
Pt c/o SOB , cough with blood tinge sputum x 2 days. HX PE

## 2016-10-13 NOTE — ED Notes (Signed)
Pt reports swelling and pain to R leg a few days before the ShOB started. Pt reports painful inspiration.

## 2016-10-14 ENCOUNTER — Encounter (HOSPITAL_COMMUNITY): Payer: Self-pay | Admitting: Internal Medicine

## 2016-10-14 DIAGNOSIS — F17210 Nicotine dependence, cigarettes, uncomplicated: Secondary | ICD-10-CM | POA: Diagnosis not present

## 2016-10-14 DIAGNOSIS — F419 Anxiety disorder, unspecified: Secondary | ICD-10-CM | POA: Diagnosis not present

## 2016-10-14 DIAGNOSIS — K219 Gastro-esophageal reflux disease without esophagitis: Secondary | ICD-10-CM | POA: Diagnosis not present

## 2016-10-14 DIAGNOSIS — F17200 Nicotine dependence, unspecified, uncomplicated: Secondary | ICD-10-CM

## 2016-10-14 DIAGNOSIS — I2699 Other pulmonary embolism without acute cor pulmonale: Secondary | ICD-10-CM | POA: Diagnosis present

## 2016-10-14 DIAGNOSIS — R109 Unspecified abdominal pain: Secondary | ICD-10-CM | POA: Diagnosis not present

## 2016-10-14 DIAGNOSIS — K5900 Constipation, unspecified: Secondary | ICD-10-CM | POA: Diagnosis not present

## 2016-10-14 LAB — CBC
HEMATOCRIT: 40.3 % (ref 36.0–46.0)
HEMOGLOBIN: 13.5 g/dL (ref 12.0–15.0)
MCH: 31.7 pg (ref 26.0–34.0)
MCHC: 33.5 g/dL (ref 30.0–36.0)
MCV: 94.6 fL (ref 78.0–100.0)
Platelets: 261 10*3/uL (ref 150–400)
RBC: 4.26 MIL/uL (ref 3.87–5.11)
RDW: 13.5 % (ref 11.5–15.5)
WBC: 7.2 10*3/uL (ref 4.0–10.5)

## 2016-10-14 LAB — BASIC METABOLIC PANEL
ANION GAP: 8 (ref 5–15)
BUN: 6 mg/dL (ref 6–20)
CO2: 24 mmol/L (ref 22–32)
Calcium: 8.8 mg/dL — ABNORMAL LOW (ref 8.9–10.3)
Chloride: 107 mmol/L (ref 101–111)
Creatinine, Ser: 0.7 mg/dL (ref 0.44–1.00)
GFR calc non Af Amer: >60 mL/min (ref >60)
GLUCOSE: 100 mg/dL — AB (ref 65–99)
POTASSIUM: 4.2 mmol/L (ref 3.5–5.1)
Sodium: 139 mmol/L (ref 135–145)

## 2016-10-14 LAB — TROPONIN I
Troponin I: <0.03 ng/mL (ref <0.03)
Troponin I: <0.03 ng/mL (ref <0.03)

## 2016-10-14 LAB — ANTITHROMBIN III: ANTITHROMB III FUNC: 95 % (ref 75–120)

## 2016-10-14 LAB — LIPASE, BLOOD: Lipase: 20 U/L (ref 11–51)

## 2016-10-14 LAB — GLUCOSE, CAPILLARY: Glucose-Capillary: 96 mg/dL (ref 65–99)

## 2016-10-14 MED ORDER — SODIUM CHLORIDE 0.9 % IV SOLN
INTRAVENOUS | Status: DC
  Administered 2016-10-14: 02:00:00 via INTRAVENOUS

## 2016-10-14 MED ORDER — RIVAROXABAN 15 MG PO TABS
15.0000 mg | ORAL_TABLET | Freq: Two times a day (BID) | ORAL | Status: DC
  Administered 2016-10-14 (×2): 15 mg via ORAL
  Filled 2016-10-14 (×2): qty 1

## 2016-10-14 MED ORDER — SENNOSIDES-DOCUSATE SODIUM 8.6-50 MG PO TABS: 1.0000 | ORAL_TABLET | Freq: Every evening | ORAL | Status: DC | PRN

## 2016-10-14 MED ORDER — ALBUTEROL SULFATE (2.5 MG/3ML) 0.083% IN NEBU: 2.5000 mg | INHALATION_SOLUTION | Freq: Four times a day (QID) | RESPIRATORY_TRACT | Status: DC | PRN

## 2016-10-14 MED ORDER — POLYETHYLENE GLYCOL 3350 17 G PO PACK: 17.0000 g | PACK | Freq: Every day | ORAL | 0 refills | Status: AC | PRN

## 2016-10-14 MED ORDER — MORPHINE SULFATE (PF) 4 MG/ML IV SOLN
2.0000 mg | INTRAVENOUS | Status: DC | PRN
  Administered 2016-10-14: 2 mg via INTRAVENOUS
  Filled 2016-10-14: qty 1

## 2016-10-14 MED ORDER — NICOTINE 14 MG/24HR TD PT24: 14.0000 mg | MEDICATED_PATCH | Freq: Every day | TRANSDERMAL | 0 refills | Status: AC

## 2016-10-14 MED ORDER — POLYETHYLENE GLYCOL 3350 17 G PO PACK: 17.0000 g | PACK | Freq: Every day | ORAL | Status: DC

## 2016-10-14 MED ORDER — INFLUENZA VAC SPLIT QUAD 0.5 ML IM SUSY: 0.5000 mL | PREFILLED_SYRINGE | INTRAMUSCULAR | Status: DC

## 2016-10-14 MED ORDER — ALPRAZOLAM 0.25 MG PO TABS: 0.2500 mg | ORAL_TABLET | Freq: Three times a day (TID) | ORAL | Status: DC | PRN

## 2016-10-14 MED ORDER — BIOTIN 5000 MCG PO TABS: 5000.0000 mg | ORAL_TABLET | Freq: Every day | ORAL | Status: DC

## 2016-10-14 MED ORDER — PNEUMOCOCCAL VAC POLYVALENT 25 MCG/0.5ML IJ INJ: 0.5000 mL | INJECTION | INTRAMUSCULAR | Status: DC

## 2016-10-14 MED ORDER — ONDANSETRON HCL 4 MG/2ML IJ SOLN: 4.0000 mg | Freq: Three times a day (TID) | INTRAMUSCULAR | Status: DC | PRN

## 2016-10-14 MED ORDER — ACETAMINOPHEN 325 MG PO TABS: 650.0000 mg | ORAL_TABLET | Freq: Four times a day (QID) | ORAL | Status: DC | PRN

## 2016-10-14 MED ORDER — OXYCODONE-ACETAMINOPHEN 5-325 MG PO TABS
1.0000 | ORAL_TABLET | ORAL | Status: DC | PRN
  Administered 2016-10-14 (×3): 1 via ORAL
  Filled 2016-10-14 (×4): qty 1

## 2016-10-14 MED ORDER — RIVAROXABAN 20 MG PO TABS: 20.0000 mg | ORAL_TABLET | Freq: Every day | ORAL | Status: DC

## 2016-10-14 MED ORDER — RIVAROXABAN (XARELTO) VTE STARTER PACK (15 & 20 MG): ORAL_TABLET | ORAL | 0 refills | Status: AC

## 2016-10-14 MED ORDER — SODIUM CHLORIDE 0.9% FLUSH: 3.0000 mL | Freq: Two times a day (BID) | INTRAVENOUS | Status: DC

## 2016-10-14 MED ORDER — ZOLPIDEM TARTRATE 5 MG PO TABS: 5.0000 mg | ORAL_TABLET | Freq: Every evening | ORAL | Status: DC | PRN

## 2016-10-14 MED ORDER — ACETAMINOPHEN 650 MG RE SUPP: 650.0000 mg | Freq: Four times a day (QID) | RECTAL | Status: DC | PRN

## 2016-10-14 MED ORDER — PANTOPRAZOLE SODIUM 40 MG PO TBEC
40.0000 mg | DELAYED_RELEASE_TABLET | Freq: Every day | ORAL | Status: DC | PRN
Start: 2016-10-14 — End: 2016-10-14

## 2016-10-14 MED ORDER — OXYCODONE-ACETAMINOPHEN 5-325 MG PO TABS: 2.0000 | ORAL_TABLET | Freq: Four times a day (QID) | ORAL | 0 refills | Status: AC | PRN

## 2016-10-14 NOTE — Care Management Note (Signed)
Case Management Note Marvetta Gibbons RN, BSN Unit 2W-Case Manager [phone]  Patient Details  Name: Mata Rowen Amini MRN: [id number] Date of Birth: 1976-02-20  Subjective/Objective:   Pt admitted with PE                Action/Plan: PTA pt lived at home- plan to start Xarelto- pt has Medicaid- and Xarelto is a preferred drug- copay $3- spoke with pt at bedside- 30 day free card given to pt to use on discharge- pt aware of coverage.  Expected Discharge Date:     10/14/16             Expected Discharge Plan:  Home/[facility]  In-House Referral:     Discharge planning Services  CM Consult, Medication Assistance  Post Acute Care Choice:    Choice offered to:     DME Arranged:    DME Agency:     HH Arranged:    HH Agency:     Status of Service:  Completed, signed off  If discussed at [employer] of Stay Meetings, dates discussed:    Additional Comments:  Dawayne Patricia, RN 10/14/2016, 11:24 AM

## 2016-10-14 NOTE — H&P (Signed)
History and Physical    Jennifer Mendoza HUT:[id number] DOB: September 17, 1976 DOA: 10/13/2016  Referring MD/NP/PA:   PCP: [name], MD   Patient coming from:  The patient is coming from home.  At baseline, pt is independent for most of ADL.   Chief Complaint: Chest pain, shortness of breath, hemoptysis  HPI: Jennifer Mendoza is a 40 y.Jennifer. female with medical history significant of PE two years ago (completed 6 months of Xarelto), IBS, panic attacks, tobacco abuse, GERD, depression, anxiety, who presents with chest pain, shortness breath and hemoptysis.  Patient states that she has been having shortness of breath, cough for 2 days. She coughs up a little amount of blood. Chest pain is located in the left side of chest, sharp, constant, 10 out of 10 in severity, radiating to the left back. It is pleuritic and is aggravated by coughing. No fever or chills. She also has right calf tenderness recently, which has resolved. Patient is constipated in past 4 days. She has mild bilateral lower abdominal pain. No nausea, vomiting, diarrhea symptoms of UTI. No unilateral weakness.   ED Course: pt was found to have WBC 6.9, negative troponin, negative pregnancy test, electrolytes and renal function okay, negative right lower extremity venous Doppler for DVT, temperature normal, no tachycardia, AND she 95% on room air. CTA of chest showed: Single site of pulmonary embolism identified within segmental branch of lingular pulmonary artery. No evidence of right heart strain. Stable 2 cm left thyroid lobe nodule. Patient is placed on telemetry bed for observation.   Review of Systems:   General: no fevers, chills, no changes in body weight, has fatigue HEENT: no blurry vision, hearing changes or sore throat Respiratory: has dyspnea, coughing, hemoptysis, no wheezing CV: has chest pain, no palpitations GI: no nausea, vomiting, diarrhea, has constipation and abdominal pain GU: no dysuria, burning on urination,  increased urinary frequency, hematuria  Ext: no leg edema Neuro: no unilateral weakness, numbness, or tingling, no vision change or hearing loss Skin: no rash, no skin tear. MSK: No muscle spasm, no deformity, no limitation of range of movement in spin Heme: No easy bruising.  Travel history: No recent long distant travel.  Allergy: No Known Allergies  Past Medical History:  Diagnosis Date  . Agoraphobia with panic attacks   . Anxiety   . Depression   . GERD (gastroesophageal reflux disease)   . History of IBS   . History of pulmonary embolus (PE)   . Migraine headache without aura   . PANIC ATTACKS 01/26/2007  . Tobacco user     Past Surgical History:  Procedure Laterality Date  . CAROTID STENT  10/1998   For kidney stones    Social History:  reports that she has been smoking Cigarettes.  She has been smoking about 0.50 packs per day. She has never used smokeless tobacco. She reports that she does not drink alcohol or use drugs.  Family History:  Family History  Problem Relation Age of Onset  . Osteoporosis Mother   . Cancer Father   . Hypertension Father      Prior to Admission medications   Medication Sig Start Date End Date Taking? Authorizing Provider  Rivaroxaban (XARELTO PO) Take by mouth.   Yes Historical Provider, MD  Biotin 5000 MCG TABS Take 5,000 mg by mouth daily.     Historical Provider, MD  Coconut Oil OIL Take 15 mLs by mouth daily.     Historical Provider, MD  ibuprofen (ADVIL,MOTRIN) 800 MG  tablet Take 1 tablet (800 mg total) by mouth 3 (three) times daily. 07/02/16   Doristine Devoid, PA-C    Physical Exam: Vitals:   10/13/16 2230 10/13/16 2317 10/14/16 0011 10/14/16 0336  BP: 115/73 113/73 102/65 97/62  Pulse: 80 99 82 73  Resp: 12 18 18 18   Temp:  98.2 F (36.8 C) 97.9 F (36.6 C) 97.8 F (36.6 C)  TempSrc:  Oral Oral Oral  SpO2: 95% 97% 96% 97%  Weight:   72.7 kg (160 lb 3.2 oz)   Height:   5' 6"  (1.676 m)    General: Not in acute  distress HEENT:       Eyes: PERRL, EOMI, no scleral icterus.       ENT: No discharge from the ears and nose, no pharynx injection, no tonsillar enlargement.        Neck: No JVD, no bruit, no mass felt. Heme: No neck lymph node enlargement. Cardiac: S1/S2, RRR, No murmurs, No gallops or rubs. Respiratory: No rales, wheezing, rhonchi or rubs. GI: Soft, nondistended, has mild tenderness in both lower quadrants., no rebound pain, no organomegaly, BS present. GU: No hematuria Ext: No pitting leg edema bilaterally. 2+DP/PT pulse bilaterally. Has tenderness in right calf area. Musculoskeletal: No joint deformities, No joint redness or warmth, no limitation of ROM in spin. Skin: No rashes.  Neuro: Alert, oriented X3, cranial nerves II-XII grossly intact, moves all extremities normally.  Psych: Patient is not psychotic, no suicidal or hemocidal ideation.  Labs on Admission: I have personally reviewed following labs and imaging studies  CBC:  Recent Labs Lab 10/13/16 1850  WBC 6.9  HGB 12.8  HCT 38.9  MCV 94.6  PLT 151   Basic Metabolic Panel:  Recent Labs Lab 10/13/16 1850  NA 137  K 3.9  CL 102  CO2 27  GLUCOSE 86  BUN 7  CREATININE 0.61  CALCIUM 9.2   GFR: Estimated Creatinine Clearance: 95.5 mL/min (by C-G formula based on SCr of 0.61 mg/dL). Liver Function Tests: No results for input(s): AST, ALT, ALKPHOS, BILITOT, PROT, ALBUMIN in the last 168 hours. No results for input(s): LIPASE, AMYLASE in the last 168 hours. No results for input(s): AMMONIA in the last 168 hours. Coagulation Profile: No results for input(s): INR, PROTIME in the last 168 hours. Cardiac Enzymes:  Recent Labs Lab 10/13/16 1850 10/14/16 0137  TROPONINI <0.03 <0.03   BNP (last 3 results) No results for input(s): PROBNP in the last 8760 hours. HbA1C: No results for input(s): HGBA1C in the last 72 hours. CBG: No results for input(s): GLUCAP in the last 168 hours. Lipid Profile: No results  for input(s): CHOL, HDL, LDLCALC, TRIG, CHOLHDL, LDLDIRECT in the last 72 hours. Thyroid Function Tests: No results for input(s): TSH, T4TOTAL, FREET4, T3FREE, THYROIDAB in the last 72 hours. Anemia Panel: No results for input(s): VITAMINB12, FOLATE, FERRITIN, TIBC, IRON, RETICCTPCT in the last 72 hours. Urine analysis:    Component Value Date/Time   COLORURINE YELLOW 06/03/2014 0429   APPEARANCEUR CLEAR 06/03/2014 0429   LABSPEC 1.045 (H) 06/03/2014 0429   PHURINE 7.0 06/03/2014 0429   GLUCOSEU NEGATIVE 06/03/2014 0429   HGBUR MODERATE (A) 06/03/2014 0429   HGBUR negative 03/05/2008 1057   BILIRUBINUR NEGATIVE 06/03/2014 0429   BILIRUBINUR NEG 09/15/2011 1436   KETONESUR NEGATIVE 06/03/2014 0429   PROTEINUR NEGATIVE 06/03/2014 0429   UROBILINOGEN 0.2 06/03/2014 0429   NITRITE NEGATIVE 06/03/2014 0429   LEUKOCYTESUR NEGATIVE 06/03/2014 0429   Sepsis Labs: @LABRCNTIP (procalcitonin:4,lacticidven:4) )  No results found for this or any previous visit (from the past 240 hour(s)).   Radiological Exams on Admission: Ct Angio Chest Pe W And/or Wo Contrast  Result Date: 10/13/2016 CLINICAL DATA:  Left chest pain and shortness of breath and nausea for 2 days. Previous history of pulmonary embolism. EXAM: CT ANGIOGRAPHY CHEST WITH CONTRAST TECHNIQUE: Multidetector CT imaging of the chest was performed using the standard protocol during bolus administration of intravenous contrast. Multiplanar CT image reconstructions and MIPs were obtained to evaluate the vascular anatomy. CONTRAST:  100 mL Isovue 370 COMPARISON:  07/02/2016 FINDINGS: Cardiovascular: Satisfactory opacification of pulmonary arteries noted. A single site of pulmonary embolism is seen within a segmental branch of the lingular pulmonary artery. No other definite sites of pulmonary embolism identified. No evidence of thoracic aortic aneurysm or dissection. No evidence of right heart strain. Mediastinum/Nodes: No pathologically enlarged  lymph nodes identified. 2 cm left thyroid lobe nodule shows no significant change since 2015. Lungs/Pleura: Peripheral pulmonary infiltrate is seen in the anterior aspect of the lingula, suspicious for pulmonary infarct. Previously seen patchy infiltrate in right lower lobe has improved, with residual linear opacity consistent with scarring. Scarring is also noted in the left lung base, with a focal area of airspace opacity in the posterior left lower lobe, which may be due to pneumonia or infarct, although no definite pulmonary emboli are seen within the left lower lobe pulmonary arteries. Mild emphysema again noted. Upper abdomen: No acute findings. Musculoskeletal: No suspicious bone lesions or other significant abnormality identified. IMPRESSION: Single site of pulmonary embolism identified within segmental branch of lingular pulmonary artery. No other definite sites of pulmonary embolism identified. No evidence of right heart strain. Peripheral pulmonary infiltrate in anterior lingula is suspicious for pulmonary infarct. Focal airspace disease also noted in the posterior left lower lobe, which could be due to pneumonia or infarct, although no definite pulmonary emboli seen in this location. Mild emphysema. Stable 2 cm left thyroid lobe nodule. Critical Value/emergent results were called by telephone at the time of interpretation on 10/13/2016 at 8:36 pm to Dr. Thomasene Lot, who verbally acknowledged these results. Electronically Signed   By: Earle Gell M.D.   On: 10/13/2016 20:36   US Venous Img Lower Unilateral Right  Result Date: 10/13/2016 CLINICAL DATA:  Chronic right intermittent calf swelling and shortness of breath. Initial encounter. EXAM: RIGHT LOWER EXTREMITY VENOUS DOPPLER ULTRASOUND TECHNIQUE: Gray-scale sonography with graded compression, as well as color Doppler and duplex ultrasound were performed to evaluate the lower extremity deep venous systems from the level of the common femoral vein and  including the common femoral, femoral, profunda femoral, popliteal and calf veins including the posterior tibial, peroneal and gastrocnemius veins when visible. The superficial great saphenous vein was also interrogated. Spectral Doppler was utilized to evaluate flow at rest and with distal augmentation maneuvers in the common femoral, femoral and popliteal veins. COMPARISON:  None. FINDINGS: Contralateral Common Femoral Vein: Respiratory phasicity is normal and symmetric with the symptomatic side. No evidence of thrombus. Normal compressibility. Common Femoral Vein: No evidence of thrombus. Normal compressibility, respiratory phasicity and response to augmentation. Saphenofemoral Junction: No evidence of thrombus. Normal compressibility and flow on color Doppler imaging. Profunda Femoral Vein: No evidence of thrombus. Normal compressibility and flow on color Doppler imaging. Femoral Vein: No evidence of thrombus. Normal compressibility, respiratory phasicity and response to augmentation. Popliteal Vein: No evidence of thrombus. Normal compressibility, respiratory phasicity and response to augmentation. Calf Veins: No evidence of thrombus. Normal compressibility  and flow on color Doppler imaging. Superficial Great Saphenous Vein: No evidence of thrombus. Normal compressibility and flow on color Doppler imaging. Venous Reflux:  None. Other Findings:  None. IMPRESSION: No evidence of deep venous thrombosis. Electronically Signed   By: Garald Balding M.D.   On: 10/13/2016 21:53     EKG: Independently reviewed.  Sinus rhythm, QTC 438, poor R-wave progression  Assessment/Plan Principal Problem:   Pulmonary embolus (HCC) Active Problems:   Anxiety state   TOBACCO DEPENDENCE   GERD (gastroesophageal reflux disease)   PE (pulmonary thromboembolism) (HCC)   Constipation   Abdominal pain   Recurrent pulmonary embolus:  CTA of chest showed single site of pulmonary embolism within segmental branch of lingular  pulmonary artery. No evidence of right heart strain. Currently hemodynamically stable. Right Lower extremity venous Doppler is negative for DVT.  -place on tele bed for obs -heparin drip initiated -2D echocardiogram ordered -trop x 3 -Hypercoag panel -pain control: When necessary Percocet and morphine  GERD: -Protonix  Constipation and mild AP: Patient has been constipated in the past 4 days. She has some mild bilateral lower abdomen pain. Physical examination is benign, no acute abdomen, likely due to constipation. -MiraLAX, senokot for constipation -Check lipase -prn zofran for nausea.  Anxiety:  -prn xanax  Tobacco abuse: -Did counseling about importance of quitting smoking -Nicotine patch   DVT ppx: on IV heparin Code Status: Full code Family Communication: Yes, patient's daughter at bed side Disposition Plan:  Anticipate discharge back to previous home environment Consults called:  none Admission status: Obs / tele  Date of Service 10/14/2016    Ivor Costa [facility] Pager [phone]  If 7PM-7AM, please contact night-coverage [email or web address] Password [email or web address] 10/14/2016, 3:55 AM

## 2016-10-14 NOTE — Progress Notes (Signed)
Patient has arrived to the floor with [facility]link, Dr.Kakrakandy has been paged per order. Patient is stable, in bed with family in the room and call bell in reach. Will continue to monitor.

## 2016-10-14 NOTE — Progress Notes (Signed)
[place] for heparin Indication: PE  No Known Allergies  Patient Measurements: Height: 5' 6"  (167.6 cm) Weight: 160 lb 3.2 oz (72.7 kg) IBW/kg (Calculated) : 59.3 Heparin Dosing Weight: 70.3 kg  Vital Signs: Temp: 97.8 F (36.6 C) (11/16 0336) Temp Source: Oral (11/16 0336) BP: 97/62 (11/16 0336) Pulse Rate: 73 (11/16 0336)  Labs:  Recent Labs  10/13/16 1850 10/14/16 0137 10/14/16 0721  HGB 12.8  --  13.5  HCT 38.9  --  40.3  PLT 289  --  261  CREATININE 0.61  --  0.70  TROPONINI <0.03 <0.03 <0.03    Estimated Creatinine Clearance: 95.5 mL/min (by C-G formula based on SCr of 0.7 mg/dL).   Medical History: Past Medical History:  Diagnosis Date  . Agoraphobia with panic attacks   . Anxiety   . Depression   . GERD (gastroesophageal reflux disease)   . History of IBS   . History of pulmonary embolus (PE)   . Migraine headache without aura   . PANIC ATTACKS 01/26/2007  . Tobacco user     Assessment: 40 yo F presents on 11/15 with SOB. CT showed new PE. Patient has a history of PE and is on Xarelto. Continuing on heparin, last dose of Xarelto per patient was 11/15. CBC stable. Noted has had blood tinged sputum for the past 2 days, no bleed issues reported per RN inpatient. Pharmacy consulted to transition from heparin back to Xarelto. Will restart VTE treatment dose with new PE.  Goal of Therapy:  VTE treatment/prevention Monitor platelets by anticoagulation protocol: Yes   Plan:  Heparin IV > Xarelto (communicated with RN to turn off heparin drip at time of 1st dose) Xarelto 65m BID with meals x 21 days for new PE; then 222mQsupper Monitor CBC, s/sx of bleed   HaElicia LampPharmD, BCPS Clinical Pharmacist 10/14/2016 9:25 AM

## 2016-10-14 NOTE — Discharge Instructions (Addendum)
Information on my medicine - XARELTO (rivaroxaban)  This medication education was reviewed with me or my healthcare representative as part of my discharge preparation.  WHY WAS XARELTO PRESCRIBED FOR YOU? Xarelto was prescribed to treat blood clots that may have been found in the veins of your legs (deep vein thrombosis) or in your lungs (pulmonary embolism) and to reduce the risk of them occurring again.  What do you need to know about Xarelto? The starting dose is one 15 mg tablet taken TWICE daily with food for the FIRST 21 DAYS then on  11/04/2016  the dose is changed to one 20 mg tablet taken ONCE A DAY with your evening meal.  DO NOT stop taking Xarelto without talking to the health care provider who prescribed the medication.  Refill your prescription for 20 mg tablets before you run out.  After discharge, you should have regular check-up appointments with your healthcare provider that is prescribing your Xarelto.  In the future your dose may need to be changed if your kidney function changes by a significant amount.  What do you do if you miss a dose? If you are taking Xarelto TWICE DAILY and you miss a dose, take it as soon as you remember. You may take two 15 mg tablets (total 30 mg) at the same time then resume your regularly scheduled 15 mg twice daily the next day.  If you are taking Xarelto ONCE DAILY and you miss a dose, take it as soon as you remember on the same day then continue your regularly scheduled once daily regimen the next day. Do not take two doses of Xarelto at the same time.   Important Safety Information Xarelto is a blood thinner medicine that can cause bleeding. You should call your healthcare provider right away if you experience any of the following: ? Bleeding from an injury or your nose that does not stop. ? Unusual colored urine (red or dark brown) or unusual colored stools (red or black). ? Unusual bruising for unknown reasons. ? A serious fall  or if you hit your head (even if there is no bleeding).  Some medicines may interact with Xarelto and might increase your risk of bleeding while on Xarelto. To help avoid this, consult your healthcare provider or pharmacist prior to using any new prescription or non-prescription medications, including herbals, vitamins, non-steroidal anti-inflammatory drugs (NSAIDs) and supplements.  This website has more information on Xarelto: [email or web address]    Pulmonary Embolism A pulmonary embolism (PE) is a sudden blockage or decrease of blood flow in one lung or both lungs. Most blockages come from a blood clot that travels from the legs or the pelvis to the lungs. PE is a dangerous and potentially life-threatening condition if it is not treated right away. What are the causes? A pulmonary embolism occurs most commonly when a blood clot travels from one of your veins to your lungs. Rarely, PE is caused by air, fat, amniotic fluid, or part of a tumor traveling through your veins to your lungs. What increases the risk? A PE is more likely to develop in:  People who smoke.  People who areolder, especially over 87 years of age.  People who are overweight (obese).  People who sit or lie still for a long time, such as during long-distance travel (over 4 hours), bed rest, hospitalization, or during recovery from certain medical conditions like a stroke.  People who do not engage in much physical activity (sedentary lifestyle).  People who have  chronic breathing disorders.  People whohave a personal or family history of blood clots or blood clotting disease.  People whohave peripheral vascular disease (PVD), diabetes, or some types of cancer.  People who haveheart disease, especially if the person had a recent heart attack or has congestive heart failure.  People who have neurological diseases that affect the legs (leg paresis).  People who have had a traumatic injury, such as breaking a hip  or leg.  People whohave recently had major or lengthy surgery, especially on the hip, knee, or abdomen.  People who have hada central line placed inside a large vein.  People who takemedicines that contain the hormone estrogen. These include birth control pills and hormone replacement therapy.  Pregnancy or during childbirth or the postpartum period. What are the signs or symptoms? The symptoms of a PE usually start suddenly and include:  Shortness of breath while active or at rest.  Coughing or coughing up blood or blood-tinged mucus.  Chest pain that is often worse with deep breaths.  Rapid or irregular heartbeat.  Feeling light-headed or dizzy.  Fainting.  Feelinganxious.  Sweating. There may also be pain and swelling in a leg if that is where the blood clot started. These symptoms may represent a serious problem that is an emergency. Do not wait to see if the symptoms will go away. Get medical help right away. Call your local emergency services (911 in the [place] Do not drive yourself to the hospital.  How is this diagnosed? Your health care provider will take a medical history and perform a physical exam. You may also have other tests, including:  Blood tests to assess the clotting properties of your blood, assess oxygen levels in your blood, and find blood clots.  Imaging tests, such as CT, ultrasound, MRI, X-ray, and other tests to see if you have clots anywhere in your body.  An electrocardiogram (ECG) to look for heart strain from blood clots in the lungs. How is this treated? The main goals of PE treatment are:  To stop a blood clot from growing larger.  To stop new blood clots from forming. The type of treatment that you receive depends on many factors, such as the cause of your PE, your risk for bleeding or developing more clots, and other medical conditions that you have. Sometimes, a combination of treatments is necessary. This condition may be treated  with:  Medicines, including newer oral blood thinners (anticoagulants), warfarin, low molecular weight heparins, thrombolytics, or heparins.  Wearing compression stockings or using different types of devices.  Surgery (rare) to remove the blood clot or to place a filter in your abdomen to stop the blood clot from traveling to your lungs. Treatments for a PE are often divided into immediate treatment, long-term treatment (up to 3 months after PE), and extended treatment (more than 3 months after PE). Your treatment may continue for several months. This is called maintenance therapy, and it is used to prevent the forming of new blood clots. You can work with your health care provider to choose the treatment program that is best for you. What are anticoagulants?  Anticoagulants are medicines that treat PEs. They can stop current blood clots from growing and stop new clots from forming. They cannot dissolve existing clots. Your body dissolves clots by itself over time. Anticoagulants are given by mouth, by injection, or through an IV tube. What are thrombolytics?  Thrombolytics are clot-dissolving medicines that are used to dissolve a PE. They carry  a high risk of bleeding, so they tend to be used only in severe cases or if you have very low blood pressure. Follow these instructions at home: If you are taking a newer oral anticoagulant:  Take the medicine every single day at the same time each day.  Understand what foods and drugs interact with this medicine.  Understand that there are no regular blood tests required when using this medicine.  Understandthe side effects of this medicine, including excessive bruising or bleeding. Ask your health care provider or pharmacist about other possible side effects. If you are taking warfarin:  Understand how to take warfarin and know which foods can affect how warfarin works in Veterinary surgeon.  Understand that it is dangerous to taketoo much or too little  warfarin. Too much warfarin increases the risk of bleeding. Too little warfarin continues to allow the risk for blood clots.  Follow your PT and INR blood testing schedule. The PT and INR results allow your health care provider to adjust your dose of warfarin. It is very important that you have your PT and INR tested as often as told by your health care provider.  Avoid major changes in your diet, or tell your health care provider before you change your diet. Arrange a visit with a registered dietitian to answer your questions. Many foods, especially foods that are high in vitamin K, can interfere with warfarin and affect the PT and INR results. Eat a consistent amount of foods that are high in vitamin K, such as:  Spinach, kale, broccoli, cabbage, collard greens, turnip greens, Brussels sprouts, peas, cauliflower, seaweed, and parsley.  Beef liver and pork liver.  Green tea.  Soybean oil.  Tell your health care provider about any and all medicines, vitamins, and supplements that you take, including aspirin and other over-the-counter anti-inflammatory medicines. Be especially cautious with aspirin and anti-inflammatory medicines. Do not take those before you ask your health care provider if it is safe to do so. This is important because many medicines can interfere with warfarin and affect the PT and INR results.  Do not start or stop taking any over-the-counter or prescription medicine unless your health care provider or pharmacist tells you to do so. If you take warfarin, you will also need to do these things:  Hold pressure over cuts for longer than usual.  Tell your dentist and other health care providers that you are taking warfarin before you have any procedures in which bleeding may occur.  Avoid alcohol or drink very small amounts. Tell your health care provider if you change your alcohol intake.  Do not use tobacco products, including cigarettes, chewing tobacco, and e-cigarettes.  If you need help quitting, ask your health care provider.  Avoid contact sports. General instructions  Take over-the-counter and prescription medicines only as told by your health care provider. Anticoagulant medicines can have side effects, including easy bruising and difficulty stopping bleeding. If you are prescribed an anticoagulant, you will also need to do these things:  Hold pressure over cuts for longer than usual.  Tell your dentist and other health care providers that you are taking anticoagulants before you have any procedures in which bleeding may occur.  Avoid contact sports.  Wear a medical alert bracelet or carry a medical alert card that says you have had a PE.  Ask your health care provider how soon you can go back to your normal activities. Stay active to prevent new blood clots from forming.  Make sure  to exercise while traveling or when you have been sitting or standing for a long period of time. It is very important to exercise. Exercise your legs by walking or by tightening and relaxing your leg muscles often. Take frequent walks.  Wear compression stockings as told by your health care provider to help prevent more blood clots from forming.  Do not use tobacco products, including cigarettes, chewing tobacco, and e-cigarettes. If you need help quitting, ask your health care provider.  Keep all follow-up appointments with your health care provider. This is important. How is this prevented? Take these actions to decrease your risk of developing another PE:  Exercise regularly. For at least 30 minutes every day, engage in:  Activity that involves moving your arms and legs.  Activity that encourages good blood flow through your body by increasing your heart rate.  Exercise your arms and legs every hour during long-distance travel (over 4 hours). Drink plenty of water and avoid drinking alcohol while traveling.  Avoid sitting or lying in bed for long periods of time  without moving your legs.  Maintain a weight that is appropriate for your height. Ask your health care provider what weight is healthy for you.  If you are a woman who is over 47 years of age, avoid unnecessary use of medicines that contain estrogen. These include birth control pills.  Do not smoke, especially if you take estrogen medicines. If you need help quitting, ask your health care provider.  If you are at very high risk for PE, wear compression stockings.  If you recently had a PE, have regularly scheduled ultrasound testing on your legs to check for new blood clots. If you are hospitalized, prevention measures may include:  Early walking after surgery, as soon as your health care provider says that it is safe.  Receiving anticoagulants to prevent blood clots. If you cannot take anticoagulants, other options may be available, such as wearing compression stockings or using different types of devices. Get help right away if:  You have new or increased pain, swelling, or redness in an arm or leg.  You have numbness or tingling in an arm or leg.  You have shortness of breath while active or at rest.  You have chest pain.  You have a rapid or irregular heartbeat.  You feel light-headed or dizzy.  You cough up blood.  You notice blood in your vomit, bowel movement, or urine.  You have a fever. These symptoms may represent a serious problem that is an emergency. Do not wait to see if the symptoms will go away. Get medical help right away. Call your local emergency services (911 in the [place] Do not drive yourself to the hospital.  This information is not intended to replace advice given to you by your health care provider. Make sure you discuss any questions you have with your health care provider. Document Released: 11/12/2000 Document Revised: 04/22/2016 Document Reviewed: 03/12/2015 Elsevier Interactive Patient Education  2017 [employer].

## 2016-10-14 NOTE — Progress Notes (Signed)

## 2016-10-14 NOTE — Discharge Summary (Signed)
Physician Discharge [name]:[id number] DOB: 03-13-1976 DOA: 10/13/2016  PCP: [name], MD  Admit date: 10/13/2016 Discharge date: 10/14/2016  Admitted From:Home Disposition:  Home  Recommendations for Outpatient Follow-up:  1. Follow up with PCP on 11/27 (appointment scheduled) 2. Patient is being discharged on Xarelto for anticoagulation. Please follow hypercoagulable panel sent on admission.  Home Health: None Equipment/Devices: None  Discharge Condition: Stable CODE STATUS: Full code Diet recommendation: Regular      Discharge Diagnoses:  Principal Problem:    Embolism, pulmonary with infarction ([place])  Active Problems:   Anxiety state   TOBACCO DEPENDENCE   GERD (gastroesophageal reflux disease)   Constipation   Abdominal pain  Brief narrative/history of present illness 40 year old female with history of PE 2 years back (completed 6 months course of Xarelto), panic attacks, IBS, tobacco abuse, GERD, anxiety and depression presented with left-sided chest pain shortness of breath and scanty hemoptysis. Symptoms started 2 days back with a lateral chest pain worsened with deep inspiration, coughing and movement . denies any fever, chills, nausea or vomiting. She also reported a recent right calf tenderness which has resolved. Denies any headache, blurred vision, syncope, bowel or urinary symptoms. Patient informs that her brother has blood clots and is on anticoagulation. She denies recent travel, illness or being on OCPs.  In the ED vitals were stable. BC and chemistry was unremarkable. Urine pregnancy test was negative. CT angiogram of the chest showed A single site of pulmonary embolism is seen within a segmental branch of the lingular pulmonary artery. Also showed a suspicious area pulmonary infarct around the lingula. No right heart strain noted. Doppler right lower extremity negative for DVT. Patient discharged on IV heparin and observe on  telemetry.    hospital course Recurrent pulmonary embolism with infarction -Hypercoagulable panel sent on admission given unprovoked acute PE and recurrent symptoms despite 6 months treatment with anticoagulation in the past for PE. -Patient still has some pleuritic chest discomfort but much improved with pain medications. Patient maintaining O2 sat >98% on room air. She stable on cardiac monitor. Serial troponins negative. Patient started on Xarelto for oral anticoagulation after anticoagulation options, side effects, risks versus benefits discussed. Patient chooses to be on Xarelto as she has tolerated it well in the past. -Patient can be discharged home with outpatient follow-up with PCP (appointment scheduled in 10 days). Prescriptions given for pain control. -Patient needs to be on anticoagulation for at least 6 months. Follow hypercoagulable workup as outpatient. If positive she will need anticoagulation for lifelong.  Tobacco abuse Counseled on cessation. Nicotine patch prescribed.   Family communication: Daughter at bedside   Consults: None  Procedures: Doppler right lower extremity  CT angiogram of the chest    Discharge Instructions     Medication List    STOP taking these medications   XARELTO PO Replaced by:  Rivaroxaban 15 & 20 MG Tbpk     TAKE these medications   Biotin 5000 MCG Tabs Take 5,000 mg by mouth daily.   coconut oil Oil Take 15 mLs by mouth daily.   ibuprofen 800 MG tablet Commonly known as:  ADVIL,MOTRIN Take 1 tablet (800 mg total) by mouth 3 (three) times daily.   nicotine 14 mg/24hr patch Commonly known as:  NICODERM CQ Place 1 patch (14 mg total) onto the skin daily.   oxyCODONE-acetaminophen 5-325 MG tablet Commonly known as:  PERCOCET/ROXICET Take 2 tablets by mouth every 6 (six) hours as needed for moderate pain.  polyethylene glycol packet Commonly known as:  MIRALAX / GLYCOLAX Take 17 g by mouth daily as needed for  moderate constipation.   Rivaroxaban 15 & 20 MG Tbpk Take as directed on package: Start with one 79m tablet by mouth twice a day with food. On Day 22, switch to one 228mtablet once a day with food. Replaces:  XARELTO PO      Follow-up Information    GARBA,[name], MD Follow up on 10/25/2016.   Specialty:  Internal Medicine Why:  3:30 pm Contact information: [phone]        No Known Allergies    Procedures/Studies: Ct Angio Chest Pe W And/or Wo Contrast  Result Date: 10/13/2016 CLINICAL DATA:  Left chest pain and shortness of breath and nausea for 2 days. Previous history of pulmonary embolism. EXAM: CT ANGIOGRAPHY CHEST WITH CONTRAST TECHNIQUE: Multidetector CT imaging of the chest was performed using the standard protocol during bolus administration of intravenous contrast. Multiplanar CT image reconstructions and MIPs were obtained to evaluate the vascular anatomy. CONTRAST:  100 mL Isovue 370 COMPARISON:  07/02/2016 FINDINGS: Cardiovascular: Satisfactory opacification of pulmonary arteries noted. A single site of pulmonary embolism is seen within a segmental branch of the lingular pulmonary artery. No other definite sites of pulmonary embolism identified. No evidence of thoracic aortic aneurysm or dissection. No evidence of right heart strain. Mediastinum/Nodes: No pathologically enlarged lymph nodes identified. 2 cm left thyroid lobe nodule shows no significant change since 2015. Lungs/Pleura: Peripheral pulmonary infiltrate is seen in the anterior aspect of the lingula, suspicious for pulmonary infarct. Previously seen patchy infiltrate in right lower lobe has improved, with residual linear opacity consistent with scarring. Scarring is also noted in the left lung base, with a focal area of airspace opacity in the posterior left lower lobe, which may be due to pneumonia or infarct, although no definite pulmonary emboli are seen within the  left lower lobe pulmonary arteries. Mild emphysema again noted. Upper abdomen: No acute findings. Musculoskeletal: No suspicious bone lesions or other significant abnormality identified. IMPRESSION: Single site of pulmonary embolism identified within segmental branch of lingular pulmonary artery. No other definite sites of pulmonary embolism identified. No evidence of right heart strain. Peripheral pulmonary infiltrate in anterior lingula is suspicious for pulmonary infarct. Focal airspace disease also noted in the posterior left lower lobe, which could be due to pneumonia or infarct, although no definite pulmonary emboli seen in this location. Mild emphysema. Stable 2 cm left thyroid lobe nodule. Critical Value/emergent results were called by telephone at the time of interpretation on 10/13/2016 at 8:36 pm to Dr. MaThomasene Lotwho verbally acknowledged these results. Electronically Signed   By: JoEarle Gell.D.   On: 10/13/2016 20:36   Us[place]enous Img Lower Unilateral Right  Result Date: 10/13/2016 CLINICAL DATA:  Chronic right intermittent calf swelling and shortness of breath. Initial encounter. EXAM: RIGHT LOWER EXTREMITY VENOUS DOPPLER ULTRASOUND TECHNIQUE: Gray-scale sonography with graded compression, as well as color Doppler and duplex ultrasound were performed to evaluate the lower extremity deep venous systems from the level of the common femoral vein and including the common femoral, femoral, profunda femoral, popliteal and calf veins including the posterior tibial, peroneal and gastrocnemius veins when visible. The superficial great saphenous vein was also interrogated. Spectral Doppler was utilized to evaluate flow at rest and with distal augmentation maneuvers in the common femoral, femoral and popliteal veins. COMPARISON:  None. FINDINGS: Contralateral Common Femoral Vein: Respiratory phasicity  is normal and symmetric with the symptomatic side. No evidence of thrombus. Normal compressibility. Common  Femoral Vein: No evidence of thrombus. Normal compressibility, respiratory phasicity and response to augmentation. Saphenofemoral Junction: No evidence of thrombus. Normal compressibility and flow on color Doppler imaging. Profunda Femoral Vein: No evidence of thrombus. Normal compressibility and flow on color Doppler imaging. Femoral Vein: No evidence of thrombus. Normal compressibility, respiratory phasicity and response to augmentation. Popliteal Vein: No evidence of thrombus. Normal compressibility, respiratory phasicity and response to augmentation. Calf Veins: No evidence of thrombus. Normal compressibility and flow on color Doppler imaging. Superficial Great Saphenous Vein: No evidence of thrombus. Normal compressibility and flow on color Doppler imaging. Venous Reflux:  None. Other Findings:  None. IMPRESSION: No evidence of deep venous thrombosis. Electronically Signed   By: Garald Balding M.D.   On: 10/13/2016 21:53       Subjective: Reports pain in her left lateral chest worsened with coughing and movement. Improved since admission.  Discharge Exam: Vitals:   10/14/16 0011 10/14/16 0336  BP: 102/65 97/62  Pulse: 82 73  Resp: 18 18  Temp: 97.9 F (36.6 C) 97.8 F (36.6 C)   Vitals:   10/13/16 2230 10/13/16 2317 10/14/16 0011 10/14/16 0336  BP: 115/73 113/73 102/65 97/62  Pulse: 80 99 82 73  Resp: 12 18 18 18   Temp:  98.2 F (36.8 C) 97.9 F (36.6 C) 97.8 F (36.6 C)  TempSrc:  Oral Oral Oral  SpO2: 95% 97% 96% 97%  Weight:   72.7 kg (160 lb 3.2 oz)   Height:   5' 6"  (1.676 m)     General: Pleasant female not in distress Cardiovascular: RRR, S1/S2 +, no murmurs rubs or gallop Respiratory: Reproducible pain on pressure over left lateral chest and underneath left breast, no rhonchi, crackles or wheeze Abdominal: Soft, NT, ND,  Musculoskeletal: Warm, no edema, calf swelling or tenderness      The results of significant diagnostics from this hospitalization (including  imaging, microbiology, ancillary and laboratory) are listed below for reference.     Microbiology: No results found for this or any previous visit (from the past 240 hour(s)).   Labs: BNP (last 3 results) No results for input(s): BNP in the last 8760 hours. Basic Metabolic Panel:  Recent Labs Lab 10/13/16 1850 10/14/16 0721  NA 137 139  K 3.9 4.2  CL 102 107  CO2 27 24  GLUCOSE 86 100*  BUN 7 6  CREATININE 0.61 0.70  CALCIUM 9.2 8.8*   Liver Function Tests: No results for input(s): AST, ALT, ALKPHOS, BILITOT, PROT, ALBUMIN in the last 168 hours.  Recent Labs Lab 10/14/16 0721  LIPASE 20   No results for input(s): AMMONIA in the last 168 hours. CBC:  Recent Labs Lab 10/13/16 1850 10/14/16 0721  WBC 6.9 7.2  HGB 12.8 13.5  HCT 38.9 40.3  MCV 94.6 94.6  PLT 289 261   Cardiac Enzymes:  Recent Labs Lab 10/13/16 1850 10/14/16 0137 10/14/16 0721  TROPONINI <0.03 <0.03 <0.03   BNP: Invalid input(s): POCBNP CBG:  Recent Labs Lab 10/14/16 0621  GLUCAP 96   D-Dimer No results for input(s): DDIMER in the last 72 hours. Hgb A1c No results for input(s): HGBA1C in the last 72 hours. Lipid Profile No results for input(s): CHOL, HDL, LDLCALC, TRIG, CHOLHDL, LDLDIRECT in the last 72 hours. Thyroid function studies No results for input(s): TSH, T4TOTAL, T3FREE, THYROIDAB in the last 72 hours.  Invalid input(s): FREET3 Anemia work  up No results for input(s): VITAMINB12, FOLATE, FERRITIN, TIBC, IRON, RETICCTPCT in the last 72 hours. Urinalysis    Component Value Date/Time   COLORURINE YELLOW 06/03/2014 0429   APPEARANCEUR CLEAR 06/03/2014 0429   LABSPEC 1.045 (H) 06/03/2014 0429   PHURINE 7.0 06/03/2014 0429   GLUCOSEU NEGATIVE 06/03/2014 0429   HGBUR MODERATE (A) 06/03/2014 0429   HGBUR negative 03/05/2008 1057   BILIRUBINUR NEGATIVE 06/03/2014 0429   BILIRUBINUR NEG 09/15/2011 1436   KETONESUR NEGATIVE 06/03/2014 0429   PROTEINUR NEGATIVE  06/03/2014 0429   UROBILINOGEN 0.2 06/03/2014 0429   NITRITE NEGATIVE 06/03/2014 0429   LEUKOCYTESUR NEGATIVE 06/03/2014 0429   Sepsis Labs Invalid input(s): PROCALCITONIN,  WBC,  LACTICIDVEN Microbiology No results found for this or any previous visit (from the past 240 hour(s)).   Time coordinating discharge: <30 minutes  SIGNED:   Louellen Molder, MD  [facility] 10/14/2016, 1:17 PM Pager   If 7PM-7AM, please contact night-coverage [email or web address] Password [email or web address]

## 2016-10-14 NOTE — Progress Notes (Signed)
SATURATION QUALIFICATIONS: (This note is used to comply with regulatory documentation for home oxygen)  Patient Saturations on Room Air at Rest = 100%  Patient Saturations on Room Air while Ambulating = 98%  Patient Saturations on  Liters of oxygen while Ambulating = %  Please briefly explain why patient needs home oxygen: No, pts sats are fine while ambulating

## 2016-10-15 LAB — CARDIOLIPIN ANTIBODIES, IGG, IGM, IGA: Anticardiolipin IgA: <9 APL U/mL (ref 0–11)

## 2016-10-15 LAB — BETA-2-GLYCOPROTEIN I ABS, IGG/M/A

## 2016-10-15 LAB — HOMOCYSTEINE: Homocysteine: 26.3 umol/L — ABNORMAL HIGH (ref 0.0–15.0)

## 2016-10-15 LAB — PROTEIN S, TOTAL: Protein S Ag, Total: 76 % (ref 60–150)

## 2016-10-15 LAB — PROTEIN S ACTIVITY: PROTEIN S ACTIVITY: 104 % (ref 63–140)

## 2016-10-15 LAB — PROTEIN C ACTIVITY: Protein C Activity: 91 % (ref 73–180)

## 2016-10-16 LAB — PROTEIN C, TOTAL: Protein C, Total: 72 % (ref 60–150)

## 2016-10-17 LAB — DRVVT MIX: DRVVT MIX: 44.6 s (ref 0.0–47.0)

## 2016-10-17 LAB — HEXAGONAL PHASE PHOSPHOLIPID: HEXAGONAL PHASE PHOSPHOLIPID: 0 s (ref 0–11)

## 2016-10-17 LAB — LUPUS ANTICOAGULANT PANEL
DRVVT: 56.9 s — ABNORMAL HIGH (ref 0.0–47.0)
PTT Lupus Anticoagulant: 55.7 s — ABNORMAL HIGH (ref 0.0–51.9)

## 2016-10-17 LAB — PTT-LA MIX: PTT-LA MIX: 52 s — AB (ref 0.0–48.9)

## 2016-10-19 LAB — FACTOR 5 LEIDEN

## 2016-10-19 LAB — PROTHROMBIN GENE MUTATION

## 2020-05-18 ENCOUNTER — Emergency Department (HOSPITAL_COMMUNITY): Payer: Medicaid Other

## 2020-05-18 ENCOUNTER — Emergency Department (HOSPITAL_COMMUNITY)
Admission: EM | Admit: 2020-05-18 | Discharge: 2020-05-19 | Disposition: A | Discharge status: Home / Self Care | Payer: Medicaid Other | Attending: Emergency Medicine | Admitting: Emergency Medicine

## 2020-05-18 ENCOUNTER — Encounter (HOSPITAL_COMMUNITY): Payer: Self-pay | Admitting: Emergency Medicine

## 2020-05-18 ENCOUNTER — Other Ambulatory Visit: Payer: Self-pay

## 2020-05-18 DIAGNOSIS — R0989 Other specified symptoms and signs involving the circulatory and respiratory systems: Secondary | ICD-10-CM | POA: Diagnosis not present

## 2020-05-18 DIAGNOSIS — Z5321 Procedure and treatment not carried out due to patient leaving prior to being seen by health care provider: Secondary | ICD-10-CM | POA: Diagnosis not present

## 2020-05-18 NOTE — ED Triage Notes (Signed)
Patient arrived with EMS from home reports a piece of steak is lodged in her lower throat while eating supper this evening , airway is intact/respirations unlabored , frequent salivation and multiple emesis .

## 2020-05-19 NOTE — ED Notes (Signed)
Called pts name 3x for updated VS with no response.

## 2021-12-23 ENCOUNTER — Telehealth: Payer: Self-pay | Admitting: Obstetrics and Gynecology

## 2021-12-23 NOTE — Telephone Encounter (Signed)
Attempted to reach patient about her appointment being rescheduled. Left a message for her to call the office.

## 2022-01-05 ENCOUNTER — Encounter (HOSPITAL_BASED_OUTPATIENT_CLINIC_OR_DEPARTMENT_OTHER): Payer: Self-pay | Admitting: *Deleted

## 2022-01-05 ENCOUNTER — Emergency Department (HOSPITAL_BASED_OUTPATIENT_CLINIC_OR_DEPARTMENT_OTHER)
Admission: EM | Admit: 2022-01-05 | Discharge: 2022-01-05 | Disposition: A | Discharge status: Home / Self Care | Payer: Medicaid Other | Attending: Emergency Medicine | Admitting: Emergency Medicine

## 2022-01-05 ENCOUNTER — Emergency Department (HOSPITAL_BASED_OUTPATIENT_CLINIC_OR_DEPARTMENT_OTHER): Payer: Medicaid Other

## 2022-01-05 ENCOUNTER — Other Ambulatory Visit: Payer: Self-pay

## 2022-01-05 DIAGNOSIS — M542 Cervicalgia: Secondary | ICD-10-CM | POA: Insufficient documentation

## 2022-01-05 DIAGNOSIS — R0602 Shortness of breath: Secondary | ICD-10-CM | POA: Insufficient documentation

## 2022-01-05 DIAGNOSIS — R079 Chest pain, unspecified: Secondary | ICD-10-CM | POA: Insufficient documentation

## 2022-01-05 LAB — BASIC METABOLIC PANEL
Anion gap: 8 (ref 5–15)
BUN: 8 mg/dL (ref 6–20)
CO2: 27 mmol/L (ref 22–32)
Calcium: 8.6 mg/dL — ABNORMAL LOW (ref 8.9–10.3)
Chloride: 100 mmol/L (ref 98–111)
Creatinine, Ser: 0.99 mg/dL (ref 0.44–1.00)
GFR, Estimated: >60 mL/min (ref >60)
Glucose, Bld: 119 mg/dL — ABNORMAL HIGH (ref 70–99)
Potassium: 3.7 mmol/L (ref 3.5–5.1)
Sodium: 135 mmol/L (ref 135–145)

## 2022-01-05 LAB — CBC WITH DIFFERENTIAL/PLATELET
Abs Immature Granulocytes: 0.01 10*3/uL (ref 0.00–0.07)
Basophils Absolute: 0.1 10*3/uL (ref 0.0–0.1)
Basophils Relative: 1 %
Eosinophils Absolute: 0.1 10*3/uL (ref 0.0–0.5)
Eosinophils Relative: 2 %
HCT: 39.7 % (ref 36.0–46.0)
Hemoglobin: 13 g/dL (ref 12.0–15.0)
Immature Granulocytes: 0 %
Lymphocytes Relative: 45 %
Lymphs Abs: 2.7 10*3/uL (ref 0.7–4.0)
MCH: 28.8 pg (ref 26.0–34.0)
MCHC: 32.7 g/dL (ref 30.0–36.0)
MCV: 87.8 fL (ref 80.0–100.0)
Monocytes Absolute: 0.3 10*3/uL (ref 0.1–1.0)
Monocytes Relative: 5 %
Neutro Abs: 2.8 10*3/uL (ref 1.7–7.7)
Neutrophils Relative %: 47 %
Platelets: 233 10*3/uL (ref 150–400)
RBC: 4.52 MIL/uL (ref 3.87–5.11)
RDW: 13.7 % (ref 11.5–15.5)
WBC: 6 10*3/uL (ref 4.0–10.5)
nRBC: 0 % (ref 0.0–0.2)

## 2022-01-05 LAB — PREGNANCY, URINE: Preg Test, Ur: NEGATIVE

## 2022-01-05 LAB — TROPONIN I (HIGH SENSITIVITY): Troponin I (High Sensitivity): <2 ng/L (ref <18)

## 2022-01-05 MED ORDER — IOHEXOL 350 MG/ML SOLN
75.0000 mL | Freq: Once | INTRAVENOUS | Status: AC | PRN
  Administered 2022-01-05: 75 mL via INTRAVENOUS

## 2022-01-05 NOTE — Discharge Instructions (Signed)
You were seen in the emergency department for left-sided chest pain and neck pain.  You had blood work EKG and a CAT scan of your chest.  There is no evidence of heart attack or blood clot.  Please continue your blood thinner and follow-up with your primary care doctor.  You are also given you contact information for the pulmonary clinic.  Return to the emergency department if any worsening or concerning symptoms.

## 2022-01-05 NOTE — ED Triage Notes (Addendum)
Left breast and arm pain x 4 days. She is taking blood thinners for blood clots. She stopped for a week due to medication being unavailable.

## 2022-01-05 NOTE — ED Provider Notes (Signed)
[facility] EMERGENCY DEPARTMENT Provider Note   CSN: [id number] Arrival date & time: 01/05/22  1736     History  No chief complaint on file.   Jennifer Mendoza is a 46 y.o. female.  She is complaining of some left chest left breast pain that radiates up into her neck and shoulder that started about 4 days ago.  She feels slightly short of breath.  No cough fevers nausea vomiting.  She has a history of PEs and she said that this feels somewhat like the time that she had a PE.  Unfortunately she did not take her anticoagulation for a week due to difficulty getting it refilled.  No other complaints.  The history is provided by the patient.  Chest Pain Pain location:  L chest Pain quality: aching   Pain radiates to:  Neck and L shoulder Pain severity:  Moderate Onset quality:  Gradual Duration:  4 days Timing:  Constant Progression:  Unchanged Chronicity:  Recurrent Context: not trauma   Relieved by:  None tried Worsened by:  Nothing Ineffective treatments:  None tried Associated symptoms: shortness of breath   Associated symptoms: no abdominal pain, no back pain, no cough, no diaphoresis, no fever, no nausea, no numbness, no vomiting and no weakness   Risk factors: prior DVT/PE   Risk factors: no coronary artery disease       Home Medications Prior to Admission medications   Medication Sig Start Date End Date Taking? Authorizing Provider  Coconut Oil OIL Take 15 mLs by mouth as needed (for skin care).     [provider]  ibuprofen (ADVIL,MOTRIN) 800 MG tablet Take 1 tablet (800 mg total) by mouth 3 (three) times daily. Patient taking differently: Take 800 mg by mouth 3 (three) times daily as needed for headache or moderate pain.  07/02/16   Doristine Devoid, PA-C  nicotine (NICODERM CQ) 14 mg/24hr patch Place 1 patch (14 mg total) onto the skin daily. 10/14/16   Dhungel, Flonnie Overman, MD  oxyCODONE-acetaminophen (PERCOCET/ROXICET) 5-325 MG tablet Take 2 tablets  by mouth every 6 (six) hours as needed for moderate pain. 10/14/16   Dhungel, Flonnie Overman, MD  polyethylene glycol (MIRALAX / GLYCOLAX) packet Take 17 g by mouth daily as needed for moderate constipation. 10/14/16   Dhungel, Flonnie Overman, MD  Rivaroxaban 15 & 20 MG TBPK Take as directed on package: Start with one 66m tablet by mouth twice a day with food. On Day 22, switch to one 257mtablet once a day with food. 10/14/16   Dhungel, NiFlonnie OvermanMD  PARoxetine (PAXIL-CR) 37.5 MG 24 hr tablet Take 37.5 mg by mouth every morning.    01/30/15  [provider]      Allergies    Patient has no known allergies.    Review of Systems   Review of Systems  Constitutional:  Negative for diaphoresis and fever.  HENT:  Negative for sore throat.   Eyes:  Negative for visual disturbance.  Respiratory:  Positive for shortness of breath. Negative for cough.   Cardiovascular:  Positive for chest pain.  Gastrointestinal:  Negative for abdominal pain, nausea and vomiting.  Musculoskeletal:  Positive for neck pain. Negative for back pain.  Neurological:  Negative for weakness and numbness.   Physical Exam Updated Vital Signs BP 128/83 (BP Location: Right Arm)    Pulse 97    Temp 97.9 F (36.6 C) (Oral)    Resp 18    Ht 5' 5"  (1.651 m)  Wt 77.1 kg    LMP 12/24/2021    SpO2 100%    BMI 28.29 kg/m  Physical Exam Vitals and nursing note reviewed.  Constitutional:      General: She is not in acute distress.    Appearance: Normal appearance. She is well-developed.  HENT:     Head: Normocephalic and atraumatic.  Eyes:     Conjunctiva/sclera: Conjunctivae normal.  Cardiovascular:     Rate and Rhythm: Normal rate and regular rhythm.     Heart sounds: No murmur heard. Pulmonary:     Effort: Pulmonary effort is normal. No respiratory distress.     Breath sounds: Normal breath sounds.  Abdominal:     Palpations: Abdomen is soft.     Tenderness: There is no abdominal tenderness. There is no guarding or rebound.   Musculoskeletal:        General: No swelling.     Cervical back: Neck supple.     Right lower leg: No edema.     Left lower leg: No edema.  Skin:    General: Skin is warm and dry.     Capillary Refill: Capillary refill takes less than 2 seconds.  Neurological:     General: No focal deficit present.     Mental Status: She is alert.     Sensory: No sensory deficit.     Motor: No weakness.     Gait: Gait normal.  Psychiatric:        Mood and Affect: Mood normal.    ED Results / Procedures / Treatments   Labs (all labs ordered are listed, but only abnormal results are displayed) Labs Reviewed  BASIC METABOLIC PANEL - Abnormal; Notable for the following components:      Result Value   Glucose, Bld 119 (*)    Calcium 8.6 (*)    All other components within normal limits  PREGNANCY, URINE  CBC WITH DIFFERENTIAL/PLATELET  TROPONIN I (HIGH SENSITIVITY)    EKG EKG Interpretation  Date/Time:  Tuesday January 05 2022 17:49:39 EST Ventricular Rate:  92 PR Interval:  134 QRS Duration: 80 QT Interval:  374 QTC Calculation: 462 R Axis:   74 Text Interpretation: Normal sinus rhythm Normal ECG When compared with ECG of 14-Oct-2016 06:16, No significant change since last tracing Confirmed by Aletta Edouard [phone]) on 01/05/2022 5:50:40 PM  Radiology CT Angio Chest PE W/Cm &/Or Wo Cm  Result Date: 01/05/2022 CLINICAL DATA:  Pulmonary embolus suspected with high probability EXAM: CT ANGIOGRAPHY CHEST WITH CONTRAST TECHNIQUE: Multidetector CT imaging of the chest was performed using the standard protocol during bolus administration of intravenous contrast. Multiplanar CT image reconstructions and MIPs were obtained to evaluate the vascular anatomy. RADIATION DOSE REDUCTION: This exam was performed according to the departmental dose-optimization program which includes automated exposure control, adjustment of the mA and/or kV according to patient size and/or use of iterative reconstruction  technique. CONTRAST:  10m OMNIPAQUE IOHEXOL 350 MG/ML SOLN COMPARISON:  10/13/2016 FINDINGS: Cardiovascular: Good opacification of the central and segmental pulmonary arteries. No focal filling defects. No evidence of significant pulmonary embolus.Normal heart size. No pericardial effusions. Normal caliber thoracic aorta. No dissection. Great vessel origins are patent. Mediastinum/Nodes: Esophagus is decompressed. No significant lymphadenopathy in the mediastinum. Prominent pericardial reflection of no clinical significance. Peripherally calcified left thyroid lesion measuring 2 cm diameter, unchanged since prior study dated 05/23/2015. Stability for greater than 5 years implies benignity; no biopsy or followup indicated (ref: J Am Coll Radiol. 2015 Feb;12(2): 143-50). Lungs/Pleura:  Diffuse emphysematous changes throughout the lungs. Dependent atelectasis in the posterior lungs. No focal consolidation. No pleural effusions. No pneumothorax. Upper Abdomen: No acute abnormalities demonstrated in the visualized upper abdomen. Musculoskeletal: No chest wall abnormality. No acute or significant osseous findings. Review of the MIP images confirms the above findings. IMPRESSION: 1. No evidence of significant pulmonary embolus. 2. Emphysematous changes in the lungs. No evidence of active pulmonary disease. Electronically Signed   By: Lucienne Capers M.D.   On: 01/05/2022 20:42    Procedures Procedures    Medications Ordered in ED Medications - No data to display  ED Course/ Medical Decision Making/ A&P Clinical Course as of 01/06/22 0924  Tue Jan 05, 2022  2107 CT does not show any evidence of blood clot.  Reviewed with patient.  D-dimer is also undetectable and do not feel she needs a second has her symptoms been going on for days.  Patient comfortable plan for following up with her outpatient provider.  She also would like contact information for pulmonary. [MB]    Clinical Course User Index [MB] Hayden Rasmussen, MD                           Medical Decision Making Amount and/or Complexity of Data Reviewed Labs: ordered. Radiology: ordered.  Risk Prescription drug management.  This patient complains of left-sided breast pain chest pain radiating to neck and shoulder; this involves an extensive number of treatment Options and is a complaint that carries with it a high risk of complications and Morbidity. The differential includes PE, muscular, ACS, pneumonia, pneumothorax, vascular, reflux  I ordered, reviewed and interpreted labs, which included CBC with normal white count normal hemoglobin, chemistries normal other than mildly elevated glucose, troponin unremarkable do not feel needs delta, pregnancy test negative  I ordered imaging studies which included CT angio chest and I independently    visualized and interpreted imaging which showed no acute findings no PE Previous records obtained and reviewed in epic no recent visits  After the interventions stated above, I reevaluated the patient and found patient to be oxygenating well and otherwise nontoxic-appearing.  No significant pathology identified.  No indications for admission at this time.  Reviewed work-up with patient and she is comfortable plan for continuing her anticoagulation.  She is concerned regarding the timing of continuation of anticoagulation.  We will give her contact information regarding pulmonary follow-up.  Return instructions discussed          Final Clinical Impression(s) / ED Diagnoses Final diagnoses:  Nonspecific chest pain  Neck pain    Rx / DC Orders ED Discharge Orders     None         Hayden Rasmussen, MD 01/06/22 [phone]

## 2022-01-07 ENCOUNTER — Encounter: Payer: Medicaid Other | Admitting: Obstetrics and Gynecology

## 2022-04-25 ENCOUNTER — Encounter (HOSPITAL_BASED_OUTPATIENT_CLINIC_OR_DEPARTMENT_OTHER): Payer: Self-pay

## 2022-04-25 ENCOUNTER — Emergency Department (HOSPITAL_BASED_OUTPATIENT_CLINIC_OR_DEPARTMENT_OTHER)
Admission: EM | Admit: 2022-04-25 | Discharge: 2022-04-26 | Disposition: A | Discharge status: Home / Self Care | Payer: Medicaid Other | Attending: Emergency Medicine | Admitting: Emergency Medicine

## 2022-04-25 ENCOUNTER — Other Ambulatory Visit (HOSPITAL_BASED_OUTPATIENT_CLINIC_OR_DEPARTMENT_OTHER): Payer: Self-pay

## 2022-04-25 ENCOUNTER — Other Ambulatory Visit: Payer: Self-pay

## 2022-04-25 DIAGNOSIS — Z86711 Personal history of pulmonary embolism: Secondary | ICD-10-CM | POA: Insufficient documentation

## 2022-04-25 DIAGNOSIS — D6851 Activated protein C resistance: Secondary | ICD-10-CM | POA: Diagnosis not present

## 2022-04-25 DIAGNOSIS — R0789 Other chest pain: Secondary | ICD-10-CM | POA: Insufficient documentation

## 2022-04-25 DIAGNOSIS — Z7901 Long term (current) use of anticoagulants: Secondary | ICD-10-CM | POA: Diagnosis not present

## 2022-04-25 DIAGNOSIS — Z862 Personal history of diseases of the blood and blood-forming organs and certain disorders involving the immune mechanism: Secondary | ICD-10-CM

## 2022-04-25 HISTORY — DX: Activated protein C resistance: D68.51

## 2022-04-25 LAB — CBC WITH DIFFERENTIAL/PLATELET
Abs Immature Granulocytes: 0.01 10*3/uL (ref 0.00–0.07)
Basophils Absolute: 0 10*3/uL (ref 0.0–0.1)
Basophils Relative: 1 %
Eosinophils Absolute: 0.2 10*3/uL (ref 0.0–0.5)
Eosinophils Relative: 3 %
HCT: 39.3 % (ref 36.0–46.0)
Hemoglobin: 13.1 g/dL (ref 12.0–15.0)
Immature Granulocytes: 0 %
Lymphocytes Relative: 52 %
Lymphs Abs: 3.3 10*3/uL (ref 0.7–4.0)
MCH: 29.2 pg (ref 26.0–34.0)
MCHC: 33.3 g/dL (ref 30.0–36.0)
MCV: 87.7 fL (ref 80.0–100.0)
Monocytes Absolute: 0.3 10*3/uL (ref 0.1–1.0)
Monocytes Relative: 5 %
Neutro Abs: 2.5 10*3/uL (ref 1.7–7.7)
Neutrophils Relative %: 39 %
Platelets: 219 10*3/uL (ref 150–400)
RBC: 4.48 MIL/uL (ref 3.87–5.11)
RDW: 13.2 % (ref 11.5–15.5)
WBC: 6.3 10*3/uL (ref 4.0–10.5)
nRBC: 0 % (ref 0.0–0.2)

## 2022-04-25 LAB — BASIC METABOLIC PANEL
Anion gap: 8 (ref 5–15)
BUN: 10 mg/dL (ref 6–20)
CO2: 27 mmol/L (ref 22–32)
Calcium: 9.3 mg/dL (ref 8.9–10.3)
Chloride: 104 mmol/L (ref 98–111)
Creatinine, Ser: 0.89 mg/dL (ref 0.44–1.00)
GFR, Estimated: >60 mL/min (ref >60)
Glucose, Bld: 91 mg/dL (ref 70–99)
Potassium: 3.9 mmol/L (ref 3.5–5.1)
Sodium: 139 mmol/L (ref 135–145)

## 2022-04-25 LAB — TROPONIN I (HIGH SENSITIVITY): Troponin I (High Sensitivity): <2 ng/L (ref <18)

## 2022-04-25 NOTE — ED Provider Notes (Signed)
[place] EMERGENCY DEPT  Provider Note  CSN: [id number] Arrival date & time: 04/25/22 2210  History Chief Complaint  Patient presents with   Shortness of Breath    Jennifer Mendoza is a 46 y.o. 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 Her PCP refills her Xarelto. She has been compliant with no missed doses. In the last 2 days she has had worsening pleuritic left sided chest pain, under her L breast, worse with deep breath. No fevers or cough. No leg swelling.    Home Medications Prior to Admission medications   Medication Sig Start Date End Date Taking? Authorizing Provider  Coconut Oil OIL Take 15 mLs by mouth as needed (for skin care).     [provider]  ibuprofen (ADVIL,MOTRIN) 800 MG tablet Take 1 tablet (800 mg total) by mouth 3 (three) times daily. Patient taking differently: Take 800 mg by mouth 3 (three) times daily as needed for headache or moderate pain.  07/02/16   Doristine Devoid, PA-C  nicotine (NICODERM CQ) 14 mg/24hr patch Place 1 patch (14 mg total) onto the skin daily. 10/14/16   Dhungel, Flonnie Overman, MD  oxyCODONE-acetaminophen (PERCOCET/ROXICET) 5-325 MG tablet Take 2 tablets by mouth every 6 (six) hours as needed for moderate pain. 10/14/16   Dhungel, Flonnie Overman, MD  polyethylene glycol (MIRALAX / GLYCOLAX) packet Take 17 g by mouth daily as needed for moderate constipation. 10/14/16   Dhungel, Flonnie Overman, MD  Rivaroxaban 15 & 20 MG TBPK Take as directed on package: Start with one 22m tablet by mouth twice a day with food. On Day 22, switch to one 281mtablet once a day with food. 10/14/16   Dhungel, NiFlonnie OvermanMD  PARoxetine (PAXIL-CR) 37.5 MG 24 hr tablet Take 37.5 mg by mouth every morning.    01/30/15  [provider]     Allergies    Patient has no known allergies.   Review of Systems   Review of  Systems Please see HPI for pertinent positives and negatives  Physical Exam BP 117/77   Pulse (!) 56   Temp 98 F (36.7 C)   Resp 18   Ht 5' 5"  (1.651 m)   Wt 75.8 kg   LMP 04/11/2022 (Approximate)   SpO2 96%   BMI 27.79 kg/m   Physical Exam Vitals and nursing note reviewed.  Constitutional:      Appearance: Normal appearance.  HENT:     Head: Normocephalic and atraumatic.     Nose: Nose normal.     Mouth/Throat:     Mouth: Mucous membranes are moist.  Eyes:     Extraocular Movements: Extraocular movements intact.     Conjunctiva/sclera: Conjunctivae normal.  Cardiovascular:     Rate and Rhythm: Normal rate.  Pulmonary:     Effort: Pulmonary effort is normal.     Breath sounds: Normal breath sounds.  Chest:     Chest wall: Tenderness (L lower chest wall) present.  Abdominal:     General: Abdomen is flat.     Palpations: Abdomen is soft.     Tenderness: There is no abdominal tenderness.  Musculoskeletal:        General: No swelling. Normal range of motion.     Cervical back: Neck supple.  Skin:    General: Skin is warm and dry.  Neurological:  General: No focal deficit present.     Mental Status: She is alert.  Psychiatric:        Mood and Affect: Mood normal.    ED Results / Procedures / Treatments   EKG EKG Interpretation  Date/Time:  Sunday Apr 25 2022 22:24:02 EDT Ventricular Rate:  81 PR Interval:  145 QRS Duration: 96 QT Interval:  387 QTC Calculation: 450 R Axis:   46 Text Interpretation: Sinus rhythm Anteroseptal infarct, age indeterminate No significant change since last tracing Confirmed by Calvert Cantor [phone]) on 04/25/2022 11:11:54 PM  Procedures Procedures  Medications Ordered in the ED Medications  iohexol (OMNIPAQUE) 350 MG/ML injection 75 mL (57 mLs Intravenous Contrast Given 04/26/22 0009)    Initial Impression and Plan  Patient with L lower chest wall pain/tenderness, history of PE compliant with Xarelto. Her vitals are  normal, but given her history will send for CTA to rule out recurrent/breakthrough PE. Labs also ordered.   ED Course   Clinical Course as of 04/26/22 0101  Sun Apr 25, 2022  2359 CBC, BMP and Trop are normal.  [CS]  Mon Apr 26, 2022  0053 I personally viewed the images from radiology studies and agree with radiologist interpretation: CT neg for recurrent PE, pain is likely MSK/chest wall given reproducibility. Given her duration of symptoms, atypical nature and negative initial Trop, repeat is not indicated.   [CS]  [id number] Patient will follow up with PCP, also referred to Hematology as she has never been seen or informed of her positive Factor V test in 2017.  [CS]    Clinical Course User Index [CS] Truddie Hidden, MD     MDM Rules/Calculators/A&P Medical Decision Making Problems Addressed: Chest wall pain: acute illness or injury History of factor V Leiden mutation: chronic illness or injury History of pulmonary embolism: chronic illness or injury  Amount and/or Complexity of Data Reviewed Labs: ordered. Radiology: ordered.  Risk Prescription drug management.    Final Clinical Impression(s) / ED Diagnoses Final diagnoses:  Chest wall pain  History of pulmonary embolism  History of factor V Leiden mutation    Rx / DC Orders ED Discharge Orders     None        Truddie Hidden, MD 04/26/22 [phone]

## 2022-04-25 NOTE — ED Triage Notes (Addendum)
Pt reports shortness of breath and sharp pains under left breast on inspiration.   Sts hx of pulmonary emboli. Takes Xarelto 72m once daily for prevention.

## 2022-04-26 ENCOUNTER — Emergency Department (HOSPITAL_BASED_OUTPATIENT_CLINIC_OR_DEPARTMENT_OTHER): Payer: Medicaid Other

## 2022-04-26 ENCOUNTER — Encounter (HOSPITAL_BASED_OUTPATIENT_CLINIC_OR_DEPARTMENT_OTHER): Payer: Self-pay

## 2022-04-26 MED ORDER — IOHEXOL 350 MG/ML SOLN
75.0000 mL | Freq: Once | INTRAVENOUS | Status: AC | PRN
  Administered 2022-04-26: 57 mL via INTRAVENOUS

## 2022-04-26 NOTE — ED Notes (Signed)
Patient transported to CT 

## 2024-02-24 ENCOUNTER — Emergency Department (HOSPITAL_BASED_OUTPATIENT_CLINIC_OR_DEPARTMENT_OTHER)
Admission: EM | Admit: 2024-02-24 | Discharge: 2024-02-24 | Disposition: A | Discharge status: Home / Self Care | Attending: Emergency Medicine | Admitting: Emergency Medicine

## 2024-02-24 ENCOUNTER — Emergency Department (HOSPITAL_BASED_OUTPATIENT_CLINIC_OR_DEPARTMENT_OTHER)

## 2024-02-24 ENCOUNTER — Other Ambulatory Visit: Payer: Self-pay

## 2024-02-24 ENCOUNTER — Encounter (HOSPITAL_BASED_OUTPATIENT_CLINIC_OR_DEPARTMENT_OTHER): Payer: Self-pay | Admitting: Emergency Medicine

## 2024-02-24 DIAGNOSIS — R42 Dizziness and giddiness: Secondary | ICD-10-CM | POA: Diagnosis not present

## 2024-02-24 DIAGNOSIS — Z7901 Long term (current) use of anticoagulants: Secondary | ICD-10-CM | POA: Insufficient documentation

## 2024-02-24 DIAGNOSIS — R209 Unspecified disturbances of skin sensation: Secondary | ICD-10-CM | POA: Insufficient documentation

## 2024-02-24 DIAGNOSIS — M79602 Pain in left arm: Secondary | ICD-10-CM | POA: Diagnosis present

## 2024-02-24 DIAGNOSIS — R0789 Other chest pain: Secondary | ICD-10-CM | POA: Insufficient documentation

## 2024-02-24 LAB — BASIC METABOLIC PANEL WITH GFR
Anion gap: 10 (ref 5–15)
BUN: 10 mg/dL (ref 6–20)
CO2: 25 mmol/L (ref 22–32)
Calcium: 9.3 mg/dL (ref 8.9–10.3)
Chloride: 105 mmol/L (ref 98–111)
Creatinine, Ser: 0.87 mg/dL (ref 0.44–1.00)
GFR, Estimated: >60 mL/min (ref >60)
Glucose, Bld: 138 mg/dL — ABNORMAL HIGH (ref 70–99)
Potassium: 3.9 mmol/L (ref 3.5–5.1)
Sodium: 140 mmol/L (ref 135–145)

## 2024-02-24 LAB — CBC
HCT: 41.3 % (ref 36.0–46.0)
Hemoglobin: 13.6 g/dL (ref 12.0–15.0)
MCH: 29.1 pg (ref 26.0–34.0)
MCHC: 32.9 g/dL (ref 30.0–36.0)
MCV: 88.2 fL (ref 80.0–100.0)
Platelets: 245 10*3/uL (ref 150–400)
RBC: 4.68 MIL/uL (ref 3.87–5.11)
RDW: 12.8 % (ref 11.5–15.5)
WBC: 6.4 10*3/uL (ref 4.0–10.5)
nRBC: 0 % (ref 0.0–0.2)

## 2024-02-24 LAB — TROPONIN I (HIGH SENSITIVITY)
Troponin I (High Sensitivity): <2 ng/L (ref <18)
Troponin I (High Sensitivity): 2 ng/L (ref <18)

## 2024-02-24 LAB — PREGNANCY, URINE: Preg Test, Ur: NEGATIVE

## 2024-02-24 LAB — CBG MONITORING, ED: Glucose-Capillary: 143 mg/dL — ABNORMAL HIGH (ref 70–99)

## 2024-02-24 MED ORDER — IOHEXOL 350 MG/ML SOLN
75.0000 mL | Freq: Once | INTRAVENOUS | Status: AC | PRN
  Administered 2024-02-24: 75 mL via INTRAVENOUS

## 2024-02-24 MED ORDER — KETOROLAC TROMETHAMINE 15 MG/ML IJ SOLN: 15.0000 mg | Freq: Once | INTRAMUSCULAR | Status: DC

## 2024-02-24 NOTE — ED Notes (Signed)
Dark Green tube sent to lab 

## 2024-02-24 NOTE — Discharge Instructions (Signed)
 Your workup today did not show any acute findings.  Please follow-up with your primary care doctor should symptoms persist.  If you experience any new or worsening symptoms please return to emergency room.

## 2024-02-24 NOTE — ED Triage Notes (Addendum)
 Pt reports Lt arm pain x 4 weeks that started radiating to neck, shoulder, head, and facial numbness over the last week  Now having left sided CP, ShoB, dizziness, nausea; reports taking brothers nitroglycerin 30 min ago  Pt is A&O x 4, somewhat shaky in triage, BEFAST exam WNL

## 2024-02-24 NOTE — ED Provider Notes (Signed)
  EMERGENCY DEPARTMENT AT [facility] Provider Note   CSN: [id number] Arrival date & time: 02/24/24  1554     History  Chief Complaint  Patient presents with   Chest Pain   Arm Pain    Jennifer Mendoza is a 48 y.o. female with history of factor V Leiden, PE on Xarelto presents with complaints of left arm pain that radiates to her neck and chest.  Additionally endorses facial numbness over the past week with feeling lightheaded like she is going to pass out.  Reports last PE was in 2017 while on Xarelto.  Reports she has been compliant with her Xarelto.  Denies any shortness of breath.  She has difficulty differentiating any aggravating relieving factors.  Reports that yesterday she felt like she was going to pass out.  Chest Pain Arm Pain Associated symptoms include chest pain.      Past Medical History:  Diagnosis Date   Agoraphobia with panic attacks    Anxiety    Depression    Factor V Leiden (HCC)    GERD (gastroesophageal reflux disease)    History of IBS    History of pulmonary embolus (PE)    Migraine headache without aura    PANIC ATTACKS 01/26/2007   Tobacco user      Home Medications Prior to Admission medications   Medication Sig Start Date End Date Taking? Authorizing Provider  Coconut Oil OIL Take 15 mLs by mouth as needed (for skin care).     [provider]  ibuprofen (ADVIL,MOTRIN) 800 MG tablet Take 1 tablet (800 mg total) by mouth 3 (three) times daily. Patient taking differently: Take 800 mg by mouth 3 (three) times daily as needed for headache or moderate pain.  07/02/16   Rise Mu, PA-C  nicotine (NICODERM CQ) 14 mg/24hr patch Place 1 patch (14 mg total) onto the skin daily. 10/14/16   Dhungel, Theda Belfast, MD  oxyCODONE-acetaminophen (PERCOCET/ROXICET) 5-325 MG tablet Take 2 tablets by mouth every 6 (six) hours as needed for moderate pain. 10/14/16   Dhungel, Theda Belfast, MD  polyethylene glycol (MIRALAX / GLYCOLAX)  packet Take 17 g by mouth daily as needed for moderate constipation. 10/14/16   Dhungel, Theda Belfast, MD  Rivaroxaban 15 & 20 MG TBPK Take as directed on package: Start with one 15mg  tablet by mouth twice a day with food. On Day 22, switch to one 20mg  tablet once a day with food. 10/14/16   Dhungel, Theda Belfast, MD  PARoxetine (PAXIL-CR) 37.5 MG 24 hr tablet Take 37.5 mg by mouth every morning.    01/30/15  [provider]      Allergies    Patient has no known allergies.    Review of Systems   Review of Systems  Cardiovascular:  Positive for chest pain.    Physical Exam Updated Vital Signs BP (!) 140/98   Pulse 71   Temp (!) 97.5 F (36.4 C)   Resp 20   Ht 5\' 6"  (1.676 m)   Wt 76.2 kg   LMP 01/13/2024 (Approximate)   SpO2 100%   BMI 27.12 kg/m  Physical Exam Vitals and nursing note reviewed.  Constitutional:      General: She is not in acute distress.    Appearance: She is well-developed.  HENT:     Head: Normocephalic and atraumatic.  Eyes:     Conjunctiva/sclera: Conjunctivae normal.  Cardiovascular:     Rate and Rhythm: Normal rate and regular rhythm.  Heart sounds: No murmur heard. Pulmonary:     Effort: Pulmonary effort is normal. No respiratory distress.     Breath sounds: Normal breath sounds.  Chest:     Chest wall: No tenderness.  Abdominal:     Palpations: Abdomen is soft.     Tenderness: There is no abdominal tenderness.  Musculoskeletal:        General: No swelling.     Cervical back: Neck supple.  Skin:    General: Skin is warm and dry.     Capillary Refill: Capillary refill takes less than 2 seconds.  Neurological:     Mental Status: She is alert.     Comments: Patient is alert and oriented. There is no abnormal phonation. Symmetric smile without facial droop. Moves all extremities spontaneously. 5/5 strength in upper and lower extremities.  Reports sensation deficit over left cheek. there is no nystagmus. EOMI, PERRL. Coordination intact with  finger to nose and normal ambulation.    Psychiatric:        Mood and Affect: Mood normal.     ED Results / Procedures / Treatments   Labs (all labs ordered are listed, but only abnormal results are displayed) Labs Reviewed  BASIC METABOLIC PANEL WITH GFR - Abnormal; Notable for the following components:      Result Value   Glucose, Bld 138 (*)    All other components within normal limits  CBG MONITORING, ED - Abnormal; Notable for the following components:   Glucose-Capillary 143 (*)    All other components within normal limits  CBC  PREGNANCY, URINE  TROPONIN I (HIGH SENSITIVITY)  TROPONIN I (HIGH SENSITIVITY)    EKG None  Radiology CT Head Wo Contrast Result Date: 02/24/2024 CLINICAL DATA:  Syncope/presyncope, cerebrovascular cause suspected EXAM: CT HEAD WITHOUT CONTRAST TECHNIQUE: Contiguous axial images were obtained from the base of the skull through the vertex without intravenous contrast. RADIATION DOSE REDUCTION: This exam was performed according to the departmental dose-optimization program which includes automated exposure control, adjustment of the mA and/or kV according to patient size and/or use of iterative reconstruction technique. COMPARISON:  None Available. FINDINGS: Brain: No acute intracranial abnormality. Specifically, no hemorrhage, hydrocephalus, mass lesion, acute infarction, or significant intracranial injury. Vascular: No hyperdense vessel or unexpected calcification. Skull: No acute calvarial abnormality. Sinuses/Orbits: No acute findings Other: None IMPRESSION: Normal study. Electronically Signed   By: Charlett Nose M.D.   On: 02/24/2024 22:42   CT Angio Chest PE W and/or Wo Contrast Result Date: 02/24/2024 CLINICAL DATA:  Pulmonary embolism (PE) suspected, low to intermediate prob, positive D-dimer. Left chest pain, shortness of breath. EXAM: CT ANGIOGRAPHY CHEST WITH CONTRAST TECHNIQUE: Multidetector CT imaging of the chest was performed using the  standard protocol during bolus administration of intravenous contrast. Multiplanar CT image reconstructions and MIPs were obtained to evaluate the vascular anatomy. RADIATION DOSE REDUCTION: This exam was performed according to the departmental dose-optimization program which includes automated exposure control, adjustment of the mA and/or kV according to patient size and/or use of iterative reconstruction technique. CONTRAST:  75mL OMNIPAQUE IOHEXOL 350 MG/ML SOLN COMPARISON:  04/26/2022 FINDINGS: Cardiovascular: No filling defects in the pulmonary arteries to suggest pulmonary emboli. Heart is normal size. Aorta is normal caliber. Mediastinum/Nodes: No mediastinal, hilar, or axillary adenopathy. Trachea and esophagus are unremarkable. Left thyroid nodule measures 2.2 cm compared to 2.5 cm previously. Lungs/Pleura: Mild emphysema. Dependent atelectasis in the lower lobes. No effusions. Upper Abdomen: No acute findings Musculoskeletal: Chest wall soft tissues are  unremarkable. No acute bony abnormality. Review of the MIP images confirms the above findings. IMPRESSION: No evidence of pulmonary embolus. No acute cardiopulmonary disease. Emphysema (ICD10-J43.9). Electronically Signed   By: Charlett Nose M.D.   On: 02/24/2024 22:41   DG Chest 2 View Result Date: 02/24/2024 CLINICAL DATA:  Chest pain. EXAM: CHEST - 2 VIEW COMPARISON:  07/02/2016. FINDINGS: Bilateral lung fields are clear. Bilateral costophrenic angles are clear. Normal cardio-mediastinal silhouette. No acute osseous abnormalities. The soft tissues are within normal limits. IMPRESSION: *No active cardiopulmonary disease. Electronically Signed   By: Jules Schick M.D.   On: 02/24/2024 16:58    Procedures Procedures    Medications Ordered in ED Medications  iohexol (OMNIPAQUE) 350 MG/ML injection 75 mL (75 mLs Intravenous Contrast Given 02/24/24 2237)    ED Course/ Medical Decision Making/ A&P                                 Medical  Decision Making Amount and/or Complexity of Data Reviewed Labs: ordered. Radiology: ordered.  Risk Prescription drug management.   This patient presents to the ED with chief complaint(s) of chest pain, near syncope.  The complaint involves an extensive differential diagnosis and also carries with it a high risk of complications and morbidity.   pertinent past medical history as listed in HPI  The differential diagnosis includes  ACS, PE, pneumonia, pneumothorax, myocarditis, pericarditis, musculoskeletal, GERD, CVA, TIA, cervical stenosis, carpal/cubital tunnel The initial plan is to  Will start with basic labs, EKG, chest x-ray Additional history obtained: Records reviewed Care Everywhere/External Records  Initial Assessment:   Hemodynamically stable, nontoxic-appearing patient presenting with multiple complaints including chest pain, left arm pain, near syncope and facial numbness.  Left arm pain for the past few months.  Chest pain and facial numbness over the past week, near syncope over the past few days.  Reports she will wake up with bilateral hand numbness.  Concern for carpal/cubital tunnel.  Denies any cardiac history.  She does have a history of multiple PEs secondary to factor V Leiden.  She is on Xarelto but has had PEs while anticoagulated in the past.  Ports compliance.  She is currently not tachypneic or hypoxic.  She is not tachycardic not requiring oxygen.  Overall low suspicion for PE but given near syncope with chest pain and significant history for PE will obtain CT angio.  Given headache, unilateral facial numbness and near syncope will obtain CT head.  Despite low suspicion for CVA/TIA.  Independent ECG interpretation:  Sinus tachycardia without ischemic changes  Independent labs interpretation:  The following labs were independently interpreted:  CBC unremarkable,   Independent visualization and interpretation of imaging: I independently visualized the following  imaging with scope of interpretation limited to determining acute life threatening conditions related to emergency care:  Chest x-ray, which revealed no cardiopulmonary disease CTA chest negative CT head without acute findings  Treatment and Reassessment: No medications administered over   Consultations obtained:   none  Disposition:   Patient will be discharged home encouraged to follow with primary care doctor.  The patient has been appropriately medically screened and/or stabilized in the ED. I have low suspicion for any other emergent medical condition which would require further screening, evaluation or treatment in the ED or require inpatient management. At time of discharge the patient is hemodynamically stable and in no acute distress. I have discussed work-up results and diagnosis  with patient and answered all questions. Patient is agreeable with discharge plan. We discussed strict return precautions for returning to the emergency department and they verbalized understanding.     Social Determinants of Health:   none  This note was dictated with voice recognition software.  Despite best efforts at proofreading, errors may have occurred which can change the documentation meaning.          Final Clinical Impression(s) / ED Diagnoses Final diagnoses:  Left arm pain  Atypical chest pain  Dizziness    Rx / DC Orders ED Discharge Orders     None         Halford Decamp, PA-C 02/24/24 2328    Vanetta Mulders, MD 02/27/24 1535

## 2024-10-16 ENCOUNTER — Ambulatory Visit
Admission: EM | Admit: 2024-10-16 | Discharge: 2024-10-16 | Disposition: A | Discharge status: Home / Self Care | Attending: Urgent Care | Admitting: Urgent Care

## 2024-10-16 DIAGNOSIS — R300 Dysuria: Secondary | ICD-10-CM | POA: Insufficient documentation

## 2024-10-16 DIAGNOSIS — N3001 Acute cystitis with hematuria: Secondary | ICD-10-CM | POA: Insufficient documentation

## 2024-10-16 LAB — POCT URINE DIPSTICK
Bilirubin, UA: NEGATIVE
Glucose, UA: NEGATIVE mg/dL
Ketones, POC UA: NEGATIVE mg/dL
Nitrite, UA: NEGATIVE
POC PROTEIN,UA: NEGATIVE
Spec Grav, UA: 1.01 (ref 1.010–1.025)
Urobilinogen, UA: 0.2 U/dL
pH, UA: 6.5 (ref 5.0–8.0)

## 2024-10-16 MED ORDER — CEPHALEXIN 500 MG PO CAPS: 500.0000 mg | ORAL_CAPSULE | Freq: Two times a day (BID) | ORAL | 0 refills | Status: AC

## 2024-10-16 NOTE — Discharge Instructions (Signed)
 Please start cephalexin  to address an urinary tract infection. Make sure you hydrate very well with plain water and a quantity of 80 ounces of water a day.  Please limit drinks that are considered urinary irritants such as fruit juices, soda, sweet tea, coffee, artifical sweetened drinks, energy drinks, alcohol.  These can worsen your urinary and genital symptoms but also be the source of them.  I will let you know about your urine culture results through MyChart to see if we need to prescribe or change your antibiotics based off of those results.

## 2024-10-16 NOTE — ED Triage Notes (Signed)
 Pt reports low back pain, leg pain, bad smell in urine, dark urine, increase urinary frequency and swollen finger x 2 days. Pt has not taken any meds for complaint.

## 2024-10-16 NOTE — ED Provider Notes (Signed)
 [facility] - URGENT [facility]  Note:  This document was prepared using Conservation officer, historic buildings and may include unintentional dictation errors.  MRN: [id number] DOB: [birth year]  Subjective:   Jennifer Mendoza is a 48 y.o. female presenting for 2-day history of dysuria, urinary frequency and urgency, malodorous urine, low back pain.  Has a history of UTIs but has never had to kind of smell that she is experiencing with her UTI this episode. Does not hydrate as consistently. Drinks a lot of coffee.   No current facility-administered medications for this encounter.  Current Outpatient Medications:    SUBOXONE 8-2 MG FILM, Place under the tongue 2 (two) times daily., Disp: , Rfl:    ALPRAZolam  (XANAX ) 0.5 MG tablet, Take 0.5 mg by mouth 2 (two) times daily as needed., Disp: , Rfl:    Coconut Oil OIL, Take 15 mLs by mouth as needed (for skin care). , Disp: , Rfl:    ibuprofen  (ADVIL ,MOTRIN ) 800 MG tablet, Take 1 tablet (800 mg total) by mouth 3 (three) times daily. (Patient taking differently: Take 800 mg by mouth 3 (three) times daily as needed for headache or moderate pain. ), Disp: 21 tablet, Rfl: 0   nicotine  (NICODERM CQ ) 14 mg/24hr patch, Place 1 patch (14 mg total) onto the skin daily., Disp: 28 patch, Rfl: 0   oxyCODONE -acetaminophen  (PERCOCET/ROXICET) 5-325 MG tablet, Take 2 tablets by mouth every 6 (six) hours as needed for moderate pain., Disp: 30 tablet, Rfl: 0   polyethylene glycol (MIRALAX  / GLYCOLAX ) packet, Take 17 g by mouth daily as needed for moderate constipation., Disp: 10 each, Rfl: 0   Rivaroxaban  15 & 20 MG TBPK, Take as directed on package: Start with one 15mg  tablet by mouth twice a day with food. On Day 22, switch to one 20mg  tablet once a day with food., Disp: 51 each, Rfl: 0   No Known Allergies  Past Medical History:  Diagnosis Date   Agoraphobia with panic attacks    Anxiety    Depression    Factor V Leiden    GERD (gastroesophageal reflux  disease)    History of IBS    History of pulmonary embolus (PE)    Migraine headache without aura    PANIC ATTACKS 01/26/2007   Tobacco user      Past Surgical History:  Procedure Laterality Date   CAROTID STENT  10/1998   For kidney stones    Family History  Problem Relation Age of Onset   Osteoporosis Mother    Cancer Father    Hypertension Father     Social History   Tobacco Use   Smoking status: Every Day    Current packs/day: 0.00    Types: Cigarettes    Last attempt to quit: 10/12/2016    Years since quitting: 8.0   Smokeless tobacco: Never   Tobacco comments:    Only 1-2 cigarettes since PE in March 2015  Vaping Use   Vaping status: Never Used  Substance Use Topics   Alcohol use: Not Currently   Drug use: No    ROS   Objective:   Vitals: BP 128/79 (BP Location: Right Arm)   Pulse 71   Temp 98.6 F (37 C) (Oral)   Resp 18   LMP  (Within Months) Comment: March 2025  SpO2 95%   Physical Exam Constitutional:      General: She is not in acute distress.    Appearance: Normal appearance. She is well-developed. She is  not ill-appearing, toxic-appearing or diaphoretic.  HENT:     Head: Normocephalic and atraumatic.     Nose: Nose normal.     Mouth/Throat:     Mouth: Mucous membranes are moist.  Eyes:     General: No scleral icterus.       Right eye: No discharge.        Left eye: No discharge.     Extraocular Movements: Extraocular movements intact.     Conjunctiva/sclera: Conjunctivae normal.  Cardiovascular:     Rate and Rhythm: Normal rate.  Pulmonary:     Effort: Pulmonary effort is normal.  Abdominal:     General: Bowel sounds are normal. There is no distension.     Palpations: Abdomen is soft. There is no mass.     Tenderness: There is no abdominal tenderness. There is no right CVA tenderness, left CVA tenderness, guarding or rebound.  Skin:    General: Skin is warm and dry.  Neurological:     General: No focal deficit present.      Mental Status: She is alert and oriented to person, place, and time.  Psychiatric:        Mood and Affect: Mood normal.        Behavior: Behavior normal.        Thought Content: Thought content normal.        Judgment: Judgment normal.     Results for orders placed or performed during the hospital encounter of 10/16/24 (from the past 24 hours)  POCT URINE DIPSTICK     Status: Abnormal   Collection Time: 10/16/24  5:27 PM  Result Value Ref Range   Color, UA yellow yellow   Clarity, UA hazy (A) clear   Glucose, UA negative negative mg/dL   Bilirubin, UA negative negative   Ketones, POC UA negative negative mg/dL   Spec Grav, UA 8.989 8.989 - 1.025   Blood, UA moderate (A) negative   pH, UA 6.5 5.0 - 8.0   POC PROTEIN,UA negative negative, trace   Urobilinogen, UA 0.2 0.2 or 1.0 E.U./dL   Nitrite, UA Negative Negative   Leukocytes, UA Small (1+) (A) Negative    Assessment and Plan :   PDMP not reviewed this encounter.  1. Acute cystitis with hematuria   2. Dysuria    Start cephalexin to cover for acute cystitis, urine culture pending.  Recommended consistent hydration, limiting urinary irritants. Counseled patient on potential for adverse effects with medications prescribed/recommended today, ER and return-to-clinic precautions discussed, patient verbalized understanding.   Christopher Savannah, PA-C 10/16/24 1736

## 2024-10-19 ENCOUNTER — Ambulatory Visit (HOSPITAL_COMMUNITY): Payer: Self-pay

## 2024-10-19 LAB — URINE CULTURE: Culture: >=100000 — AB

## 2025-04-23 ENCOUNTER — Ambulatory Visit: Admitting: "Endocrinology
